# Patient Record
Sex: Male | Born: 1975
Health system: Southern US, Community
[De-identification: ages and names within clinical notes are randomized; demographics above are authoritative.]

## PROBLEM LIST (undated history)

## (undated) DIAGNOSIS — R569 Unspecified convulsions: Secondary | ICD-10-CM

## (undated) HISTORY — DX: Unspecified convulsions: R56.9

---

## 2005-07-18 ENCOUNTER — Emergency Department (HOSPITAL_COMMUNITY): Admission: EM | Admit: 2005-07-18 | Discharge: 2005-07-18 | Payer: Self-pay | Admitting: Family Medicine

## 2009-06-17 ENCOUNTER — Encounter: Payer: Self-pay | Admitting: Family Medicine

## 2009-06-17 ENCOUNTER — Ambulatory Visit: Payer: Self-pay | Admitting: Family Medicine

## 2009-06-17 DIAGNOSIS — F17200 Nicotine dependence, unspecified, uncomplicated: Secondary | ICD-10-CM | POA: Insufficient documentation

## 2009-06-17 DIAGNOSIS — R030 Elevated blood-pressure reading, without diagnosis of hypertension: Secondary | ICD-10-CM | POA: Insufficient documentation

## 2009-06-17 LAB — CONVERTED CEMR LAB
Chloride: 105 meq/L (ref 96–112)
Cholesterol: 170 mg/dL (ref 0–200)
Glucose, Bld: 94 mg/dL (ref 70–99)
LDL Cholesterol: 109 mg/dL — ABNORMAL HIGH (ref 0–99)
Potassium: 4.2 meq/L (ref 3.5–5.3)
Sodium: 140 meq/L (ref 135–145)
Total CHOL/HDL Ratio: 3.4
Triglycerides: 54 mg/dL (ref <150)
VLDL: 11 mg/dL (ref 0–40)

## 2009-06-21 ENCOUNTER — Encounter: Payer: Self-pay | Admitting: Family Medicine

## 2009-09-02 ENCOUNTER — Emergency Department (HOSPITAL_COMMUNITY): Admission: EM | Admit: 2009-09-02 | Discharge: 2009-09-02 | Payer: Self-pay | Admitting: Family Medicine

## 2009-09-02 ENCOUNTER — Emergency Department (HOSPITAL_COMMUNITY): Admission: EM | Admit: 2009-09-02 | Discharge: 2009-09-03 | Payer: Self-pay | Admitting: Emergency Medicine

## 2010-06-24 NOTE — Letter (Signed)
Summary: Lipid Letter  [facility]  [address]   [place], [place] [address]   Phone: [phone]  Fax: [phone]    06/21/2009  Clarence Larsen [address] [place], [place]  [address]  Dear Clarence Larsen:  We have carefully reviewed your last lipid profile from 06/17/2009 and the results are noted below with a summary of recommendations for lipid management.    Cholesterol:       170     Goal: < 200   HDL "good" Cholesterol:   50     Goal: > 45   LDL "bad" Cholesterol:   109     Goal: < 100   Triglycerides:       54     Goal: < 150     Your electrolytes (sodium, potassium) were normal. Your kidney function was normal.     Adjunctive Measures (may lower LIPIDS and reduce risk of Heart Attack) include: Aerobic Exercise (20-30 minutes 3-4 times a week) Limit Alcohol Consumption Weight Reduction Dietary Fiber 20-30 grams a day by mouth     Current Medications:  None  The above results are within normal limits. No new mediations are needed at this point. Continue to watch your sodium intake, to avoid high blood pressure.  If you have any questions, please call. We appreciate being able to work with you.   Sincerely,   Phillips Hay MD [facility]  Appended Document: Lipid Letter mailed letter to pt

## 2010-06-24 NOTE — Assessment & Plan Note (Signed)
Summary: np,df   Vital Signs:  Patient profile:   35 year old male Height:      69.70 inches Weight:      218 pounds BMI:     31.66 Temp:     98.4 degrees F oral Pulse rate:   76 / minute BP sitting:   143 / 83  (left arm) Cuff size:   large  Vitals Entered By: Tessie Fass CMA (June 17, 2009 3:06 PM)    CC: complete physical Is Patient Diabetic? No Pain Assessment Patient in pain? no        CC:  complete physical.  History of Present Illness:   35 y.o. male with no pmh, has not seen a physician since he was a teenager. 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    Physical Exam  General:  Well-developed,well-nourished,in no acute distress; alert,appropriate and cooperative throughout examination Vital signs noted  Eyes:   EOMI. Perrla. Funduscopic exam benign, without hemorrhages, exudates or papilledema. Vision grossly normal. Ears:  External ear exam shows no significant lesions or deformities.  Otoscopic examination reveals clear canals, tympanic membranes are intact bilaterally without bulging, retraction, inflammation or discharge. Hearing is  grossly normal bilaterally. Mouth:  Oral mucosa and oropharynx without lesions or exudates.  Teeth in good repair.  Neck:  No deformities, masses, or tenderness noted. Lungs:  Normal respiratory effort, chest expands symmetrically. Lungs are clear to auscultation, no crackles or wheezes. Heart:  Normal rate and regular rhythm. S1 and S2 normal without gallop, murmur, click, rub or other extra sounds. Abdomen:  soft, non-tender, and normal bowel sounds.   Extremities:  no edema Neurologic:  alert & oriented X3, cranial nerves II-XII intact, strength normal in all extremities, and gait normal.     Impression & Recommendations:  Problem # 1:  Preventive Health Care (ICD-V70.0) Assessment New No red flags. Lipids and BMET done today  Problem # 2:  ELEVATED BP READING WITHOUT DX HYPERTENSION (ICD-796.2) Assessment: New See instructions below- discussed low salt diet and exercise. Handout given Orders: Basic Met-[facility] [phone]) Lipid-[facility] ([id number])  Problem # 3:  TOBACCO USER (ICD-305.1) Assessment: New Currently not ready to quit. Given handout on smoking and free cessation classes  Patient Instructions: 1)  Check your blood pressure at least  once a week and record 2)  Bring to next visit 3)  When you are ready to quit smoking please let me know  4)  I will send you your lab results 5)  Your next visit is in 3months

## 2010-11-06 ENCOUNTER — Ambulatory Visit (INDEPENDENT_AMBULATORY_CARE_PROVIDER_SITE_OTHER): Payer: Self-pay | Admitting: Family Medicine

## 2010-11-06 ENCOUNTER — Encounter: Payer: Self-pay | Admitting: Family Medicine

## 2010-11-06 VITALS — BP 138/89 | HR 80 | Temp 98.9°F | Ht 69.7 in | Wt 197.0 lb

## 2010-11-06 DIAGNOSIS — R109 Unspecified abdominal pain: Secondary | ICD-10-CM

## 2010-11-06 DIAGNOSIS — R634 Abnormal weight loss: Secondary | ICD-10-CM

## 2010-11-06 LAB — CBC WITH DIFFERENTIAL/PLATELET
Basophils Relative: 1 % (ref 0–1)
Eosinophils Absolute: 0.2 10*3/uL (ref 0.0–0.7)
Eosinophils Relative: 4 % (ref 0–5)
HCT: 54.6 % — ABNORMAL HIGH (ref 39.0–52.0)
Hemoglobin: 18.3 g/dL — ABNORMAL HIGH (ref 13.0–17.0)
MCH: 30.3 pg (ref 26.0–34.0)
MCHC: 33.5 g/dL (ref 30.0–36.0)
MCV: 90.4 fL (ref 78.0–100.0)
Monocytes Absolute: 0.4 10*3/uL (ref 0.1–1.0)
Monocytes Relative: 8 % (ref 3–12)

## 2010-11-06 LAB — COMPREHENSIVE METABOLIC PANEL
AST: 19 U/L (ref 0–37)
Alkaline Phosphatase: 72 U/L (ref 39–117)
BUN: 9 mg/dL (ref 6–23)
Creat: 0.85 mg/dL (ref 0.50–1.35)

## 2010-11-06 LAB — POCT H PYLORI SCREEN: H Pylori Screen, POC: POSITIVE

## 2010-11-06 MED ORDER — CLARITHROMYCIN 500 MG PO TABS: 500.0000 mg | ORAL_TABLET | Freq: Every day | ORAL | Status: AC

## 2010-11-06 MED ORDER — OMEPRAZOLE 20 MG PO CPDR: 20.0000 mg | DELAYED_RELEASE_CAPSULE | Freq: Two times a day (BID) | ORAL | Status: DC

## 2010-11-06 MED ORDER — AMOXICILLIN 500 MG PO CAPS: ORAL_CAPSULE | ORAL | Status: AC

## 2010-11-06 NOTE — Assessment & Plan Note (Signed)
Unintentional weight loss, no signs on exam, treat abd pain per above, H pylori infection may be contribuiting to weight loss Check routine labs, if persistant weight loss obtain CXR, CT Abd/pelvis

## 2010-11-06 NOTE — Progress Notes (Signed)
  Subjective:    Patient ID: Clarence Larsen, male    DOB: [birth year], 35 y.o.   MRN: [id number]  HPI  Abd pain x 1 week- epigastric pain, felt like indigestion, has had in the past but feels worse this week, Tums have helped and Zantac   no n/v, no fever, + black tarry stools on 2 occ within past 6 months, last event last thurs. He has had a few episodes of loose stools, no BRBPR , no difficulty with  Urine stream, no CP, SOB  Weight loss- 30lb unintentional weight loss in 6 months, smokes 1ppd,drinks 2 beers a night, no illicits, father recently diagnosed with spleen cancer  Review of Systems per above     Objective:   Physical Exam   GEN- NAD, alert and oriented, well nourished   Lymph- no cervical or clavicular nodes   CVS- RRR, no murmur   RESP- CTAB   ABD- NABS, Soft mild TTP epigastric region, no rebound, no gaurding, no hepatospleenomegaly   Ext- no edema       Assessment & Plan:

## 2010-11-06 NOTE — Patient Instructions (Signed)
Read the handout on H.Pylori bacteria We will call with your lab results Take the antibiotics as prescribed If they are too expensive please give me a call Make a follow-up appt in 1 month Keep taking the Prilosec even after you finish the antibiotics

## 2010-11-06 NOTE — Assessment & Plan Note (Addendum)
Pain more consistent with either GERD/ ulceration from H pylori, Gallbladder etiology, will also check for pancreatitis- CMET, CBC H pylori positive will treat this now - with Amox, Prilosec and Clarithromycin Note FOBT- neg at bedside, no sign of acute bleed

## 2010-11-07 ENCOUNTER — Telehealth: Payer: Self-pay | Admitting: Family Medicine

## 2010-11-07 NOTE — Telephone Encounter (Signed)
Called and left a message on pt home voice mail-please give the following message: Your kidney, liver and gallbladder labs were normal Your infection fighting cells were normal Your pancreas labs were normal  Your hemoglobin is slightly high, this is likley because of your smoking, this would be a great time to cut back and quit smoking, currently it is not at a dangerous level. This blood test will need to be repeated when you come back for your follow-up appointment

## 2010-11-11 NOTE — Telephone Encounter (Signed)
Can we make sure he received this message

## 2012-01-07 ENCOUNTER — Emergency Department (HOSPITAL_COMMUNITY)
Admission: EM | Admit: 2012-01-07 | Discharge: 2012-01-07 | Disposition: A | Discharge status: Home / Self Care | Payer: 59 | Attending: Emergency Medicine | Admitting: Emergency Medicine

## 2012-01-07 ENCOUNTER — Encounter (HOSPITAL_COMMUNITY): Payer: Self-pay | Admitting: Emergency Medicine

## 2012-01-07 DIAGNOSIS — F17200 Nicotine dependence, unspecified, uncomplicated: Secondary | ICD-10-CM | POA: Insufficient documentation

## 2012-01-07 DIAGNOSIS — M5430 Sciatica, unspecified side: Secondary | ICD-10-CM

## 2012-01-07 LAB — URINALYSIS, ROUTINE W REFLEX MICROSCOPIC
Bilirubin Urine: NEGATIVE
Hgb urine dipstick: NEGATIVE
Nitrite: NEGATIVE
Specific Gravity, Urine: 1.012 (ref 1.005–1.030)
pH: 5.5 (ref 5.0–8.0)

## 2012-01-07 MED ORDER — METHYLPREDNISOLONE 4 MG PO KIT: PACK | ORAL | Status: AC

## 2012-01-07 MED ORDER — IBUPROFEN 800 MG PO TABS: 800.0000 mg | ORAL_TABLET | Freq: Three times a day (TID) | ORAL | Status: AC

## 2012-01-07 MED ORDER — IBUPROFEN 800 MG PO TABS
800.0000 mg | ORAL_TABLET | Freq: Once | ORAL | Status: AC
  Administered 2012-01-07: 800 mg via ORAL
  Filled 2012-01-07: qty 1

## 2012-01-07 MED ORDER — OXYCODONE-ACETAMINOPHEN 5-325 MG PO TABS
2.0000 | ORAL_TABLET | Freq: Once | ORAL | Status: AC
  Administered 2012-01-07: 2 via ORAL
  Filled 2012-01-07: qty 2

## 2012-01-07 NOTE — ED Notes (Signed)
Prescriptions x2 given with discharge instructions.  

## 2012-01-07 NOTE — ED Provider Notes (Signed)
History     CSN: [id number]  Arrival date & time 01/07/12  4098   First MD Initiated Contact with Patient 01/07/12 [phone]      Chief Complaint  Patient presents with  . Back Pain    (Consider location/radiation/quality/duration/timing/severity/associated sxs/prior treatment) HPI Comments: Patient presents with 24 hours of left-sided low back pain it radiates down his left knee. He denies any injury to his back or lifting. He been taking Tylenol, Goody powder without relief. The pain was worse at night he could not sleep. He denies any weakness in the leg but does have a pins and needle sensation in the back of it. No bowel or bladder incontinence, fevers, vomiting, IV drug use. No urinary symptoms no testicular pain no abdominal pain. No previous history of back problems.  The history is provided by the patient.    History reviewed. No pertinent past medical history.  History reviewed. No pertinent past surgical history.  Family History  Problem Relation Age of Onset  . Cancer Father     Spleen cancer on chemo    History  Substance Use Topics  . Smoking status: Current Everyday Smoker -- 1.0 packs/day    Types: Cigarettes  . Smokeless tobacco: Not on file  . Alcohol Use: Yes      Review of Systems  Constitutional: Negative for fever, activity change and appetite change.  HENT: Negative for congestion and rhinorrhea.   Respiratory: Negative for cough, chest tightness and shortness of breath.   Cardiovascular: Negative for chest pain.  Gastrointestinal: Negative for nausea, vomiting and abdominal pain.  Genitourinary: Negative for dysuria, hematuria and testicular pain.  Musculoskeletal: Positive for back pain and gait problem.  Skin: Negative for rash.  Neurological: Negative for dizziness and headaches.    Allergies  Review of patient's allergies indicates no known allergies.  Home Medications   Current Outpatient Rx  Name Route Sig Dispense Refill  .  ACETAMINOPHEN 500 MG PO TABS Oral Take 1,000 mg by mouth every 6 (six) hours as needed. pain    . IBUPROFEN 800 MG PO TABS Oral Take 1 tablet (800 mg total) by mouth 3 (three) times daily. 21 tablet 0  . METHYLPREDNISOLONE 4 MG PO KIT  follow package directions 21 tablet 0  . OMEPRAZOLE 20 MG PO CPDR Oral Take 1 capsule (20 mg total) by mouth 2 (two) times daily. 60 capsule 1    BP 130/86  Pulse 88  Temp 97.7 F (36.5 C) (Oral)  Resp 14  SpO2 97%  Physical Exam  Constitutional: He is oriented to person, place, and time. He appears well-developed and well-nourished. No distress.  HENT:  Head: Normocephalic and atraumatic.  Mouth/Throat: Oropharynx is clear and moist. No oropharyngeal exudate.  Eyes: Conjunctivae are normal. Pupils are equal, round, and reactive to light.  Neck: Normal range of motion.  Cardiovascular: Normal rate, regular rhythm and normal heart sounds.   No murmur heard. Pulmonary/Chest: Effort normal and breath sounds normal. No respiratory distress.  Abdominal: Soft. There is no tenderness. There is no rebound.  Musculoskeletal: Normal range of motion. He exhibits tenderness.       Left-sided paraspinal lumbar tenderness SI joint tenderness. No step-off  Neurological: He is alert and oriented to person, place, and time. No cranial nerve deficit.       5 Out of 5 strength in bilateral lower extremities, +2 patellar reflexes bilaterally, great toe extension intact, +2 DP and PT pulses bilaterally. Valgus deformity left great toe  Skin: Skin is warm.    ED Course  Procedures (including critical care time)   Labs Reviewed  URINALYSIS, ROUTINE W REFLEX MICROSCOPIC   No results found.   1. Sciatica       MDM  Left-sided low back pain radiating to the left leg consistent with sciatica. No neurological deficits. Antalgic gait.  Sciatica-type pain without neurological deficit or red flag.  We'll treat with steroids, anti-inflammatories, pain medication and  referral to PCP. Return precautions discussed.      Glynn Octave, MD 01/07/12 [phone]

## 2012-01-07 NOTE — ED Notes (Signed)
PT. WOKE UP THIS MORNING WITH LOW BACK PAIN RADIATING TO LEFT LEG , DENIES INJURY OR FALL , AMBULATORY AT TRIAGE, NO DYSURIA OR HEMATURIA.

## 2012-01-07 NOTE — ED Notes (Signed)
Pt. Informed of need to urinate

## 2012-01-12 ENCOUNTER — Encounter: Payer: Self-pay | Admitting: Family Medicine

## 2012-01-12 ENCOUNTER — Ambulatory Visit (INDEPENDENT_AMBULATORY_CARE_PROVIDER_SITE_OTHER): Payer: 59 | Admitting: Family Medicine

## 2012-01-12 VITALS — BP 131/86 | HR 72 | Wt 187.0 lb

## 2012-01-12 DIAGNOSIS — M545 Low back pain, unspecified: Secondary | ICD-10-CM

## 2012-01-12 NOTE — Assessment & Plan Note (Signed)
Resolved at this point.  I do not see any indications for imaging at this point.  Likely can be traced back to his new workout regimen.  Advised to return if worsening again.

## 2012-01-12 NOTE — Progress Notes (Signed)
  Subjective:    Patient ID: Glade Nurse, male    DOB: March 14, 1976, 36 y.o.   MRN: [id number]  HPI  1.  F/u ED:  Patient here to f/u from ED visit on 8/15.  Was evaluated in the ED for low back pain that radiated into buttock and back of leg.  Difficulty walking 2/2 to pain at that time.  Now says that pain is greatly improved and almost resolved.  He has completed steroid pack.  He thinks this was caused by him starting to work out again and over-doing it.   No further radiation of pain into leg and no difficulty walking.  Denies bowel or bladder problems.   Review of Systems Per HPI    Objective:   Physical Exam  Constitutional: He appears well-nourished. No distress.  Abdominal: Soft. He exhibits no distension. There is no tenderness.  Musculoskeletal: Normal range of motion.       Sitting and lying SLR negative.  Able to ambulate without any difficulty.  No L spine tenderness.  Sensation intact.           Assessment & Plan:

## 2013-03-01 ENCOUNTER — Encounter (HOSPITAL_COMMUNITY): Payer: Self-pay | Admitting: Emergency Medicine

## 2013-03-01 ENCOUNTER — Emergency Department (HOSPITAL_COMMUNITY)
Admission: EM | Admit: 2013-03-01 | Discharge: 2013-03-01 | Disposition: A | Discharge status: Home / Self Care | Payer: 59 | Attending: Emergency Medicine | Admitting: Emergency Medicine

## 2013-03-01 DIAGNOSIS — M67919 Unspecified disorder of synovium and tendon, unspecified shoulder: Secondary | ICD-10-CM | POA: Insufficient documentation

## 2013-03-01 DIAGNOSIS — M719 Bursopathy, unspecified: Secondary | ICD-10-CM | POA: Insufficient documentation

## 2013-03-01 DIAGNOSIS — Z79899 Other long term (current) drug therapy: Secondary | ICD-10-CM | POA: Insufficient documentation

## 2013-03-01 DIAGNOSIS — F17200 Nicotine dependence, unspecified, uncomplicated: Secondary | ICD-10-CM | POA: Insufficient documentation

## 2013-03-01 DIAGNOSIS — M7581 Other shoulder lesions, right shoulder: Secondary | ICD-10-CM

## 2013-03-01 MED ORDER — NAPROXEN 500 MG PO TABS: 500.0000 mg | ORAL_TABLET | Freq: Two times a day (BID) | ORAL | Status: DC

## 2013-03-01 MED ORDER — TRAMADOL HCL 50 MG PO TABS: 50.0000 mg | ORAL_TABLET | Freq: Four times a day (QID) | ORAL | Status: DC | PRN

## 2013-03-01 NOTE — ED Notes (Signed)
Pt states it hurts to lift his right arm.  Pt sattes pain started Sunday and has gotten progressily worse

## 2013-03-01 NOTE — ED Provider Notes (Signed)
CSN: [id number]     Arrival date & time 03/01/13  4098 History   First MD Initiated Contact with Patient 03/01/13 [phone]     Chief Complaint  Patient presents with  . Shoulder Pain    HPI  Atraumatic right shoulder pain for 3 days.  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 He does not do any overhead repetitive motion work. Started having pain in superior and anterior  lateral aspect of the right shoulder on Friday. Is in  to the point that he really couldn't sleep last night states that thought a toothache. Hurts to move and trouble elevating his arm.  History reviewed. No pertinent past medical history. History reviewed. No pertinent past surgical history. Family History  Problem Relation Age of Onset  . Cancer Father     Spleen cancer on chemo   History  Substance Use Topics  . Smoking status: Current Every Day Smoker -- 1.00 packs/day    Types: Cigarettes  . Smokeless tobacco: Not on file  . Alcohol Use: Yes    Review of Systems  Respiratory: Negative for shortness of breath.   Cardiovascular: Negative for chest pain.  Musculoskeletal: Positive for arthralgias and myalgias.    Allergies  Review of patient's allergies indicates no known allergies.  Home Medications   Current Outpatient Rx  Name  Route  Sig  Dispense  Refill  . acetaminophen (TYLENOL) 500 MG tablet   Oral   Take 1,000 mg by mouth every 6 (six) hours as needed. pain         . naproxen (NAPROSYN) 500 MG tablet   Oral   Take 1 tablet (500 mg total) by mouth 2 (two) times daily.   30 tablet   0   . traMADol (ULTRAM) 50 MG tablet   Oral   Take 1 tablet (50 mg total) by mouth every 6 (six) hours as needed for pain.   15 tablet   0    BP 139/95  Pulse 84  Temp(Src) 99 F (37.2 C) (Oral)  Resp 18  SpO2 96% Physical Exam  Musculoskeletal:       Right shoulder: He exhibits decreased range of motion. He exhibits no swelling, no effusion and no crepitus.  As a painful ARC  with the right shoulder starting at about 30. His painful directly over the anterior aspect of the shoulder at the proximal humerus. He has pain with internal rotation more so with resisted internal rotation. Passive external rotation causes pain as well. He does have some pain with biceps flexion as well.    ED Course  Procedures (including critical care time) Labs Review Labs Reviewed - No data to display Imaging Review No results found.  MDM   1. Rotator cuff tendinitis, right    Classic exam pertinent for tendinitis. Plan rest avoid heavy or or overhead lifting. Primary care followup for orthopedic followup if not improving    Roney Marion, MD 03/01/13 [phone]

## 2015-02-13 ENCOUNTER — Ambulatory Visit (INDEPENDENT_AMBULATORY_CARE_PROVIDER_SITE_OTHER): Payer: 59 | Admitting: Family Medicine

## 2015-02-13 ENCOUNTER — Ambulatory Visit
Admission: RE | Admit: 2015-02-13 | Discharge: 2015-02-13 | Disposition: A | Discharge status: Home / Self Care | Payer: 59 | Source: Ambulatory Visit | Attending: Family Medicine | Admitting: Family Medicine

## 2015-02-13 ENCOUNTER — Encounter: Payer: Self-pay | Admitting: Family Medicine

## 2015-02-13 VITALS — BP 123/88 | HR 79 | Temp 97.8°F | Wt 240.0 lb

## 2015-02-13 DIAGNOSIS — M5442 Lumbago with sciatica, left side: Secondary | ICD-10-CM

## 2015-02-13 MED ORDER — TRAMADOL HCL 50 MG PO TABS: 50.0000 mg | ORAL_TABLET | Freq: Four times a day (QID) | ORAL | Status: DC | PRN

## 2015-02-13 NOTE — Progress Notes (Signed)
  HPI:  Pt presents for a same day appointment to discuss low back pain.  Began having L sided low back pain about 2 weeks ago. Has gotten worse since then. Has tried tylenol which helped initially but no longer getting relief. Has had cramping in L leg, and now feels numbness to L leg all the way down to toes. No weakness. No saddle anesthesia, fever, or bowel/bladder incontinence. Just has decreased sensation to L leg. The numbness initially came and went, but now is more persistent.  No visible history of imaging in our system. Pain is worse with standing, improved with sitting. Numbness is entire leg, not confined to a particular aspect of the leg.  ROS: See HPI  PMFSH: history of tobacco abuse, prior elevated BP. Denies any history of seizure.  PHYSICAL EXAM: BP 123/88 mmHg  Pulse 79  Temp(Src) 97.8 F (36.6 C) (Oral)  Wt 240 lb (108.863 kg) Gen: NAD, pleasant, cooperative HEENT: NCAT Back: low back nontender to palpation Ext: full strength bilateral lower extremities with hip flexion/abduction/adduction and knee flexion/extension. 2+ patellar reflexes bilaterally. Decreased sensation over L lower extremity with light touch. Gait normal.  ASSESSMENT/PLAN:  Low back pain Acute low back pain of 2 weeks duration, with L leg numbness. No signs of cauda equina to prompt need for emergent imaging, but with report of leg numbness warrants further investigation. Patient has full strength in both legs. Plan: - start with xrays of low back.  - anticipate need to proceed with MRI lumbar spine once xrays have returned - tramadol as needed for pain, also add ibuprofen - heating pad - f/u in 2-3 weeks with PCP or myself    FOLLOW UP: F/u in 2-3 weeks for low back pain  [place] J. Pollie Meyer, MD [facility]

## 2015-02-13 NOTE — Assessment & Plan Note (Signed)
Acute low back pain of 2 weeks duration, with L leg numbness. No signs of cauda equina to prompt need for emergent imaging, but with report of leg numbness warrants further investigation. Patient has full strength in both legs. Plan: - start with xrays of low back.  - anticipate need to proceed with MRI lumbar spine once xrays have returned - tramadol as needed for pain, also add ibuprofen - heating pad - f/u in 2-3 weeks with PCP or myself

## 2015-02-13 NOTE — Patient Instructions (Addendum)
Getting xrays Suspect we will need to get MRI Get over the counter ibuprofen Prescribed tramadol for pain - might make you sleepy so use caution Try heating pad, gentle stretching Follow up with Dr. Cathlean Cower in 2-3 weeks to see how things are going.  Be well, Dr. Pollie Meyer   Back Pain, Adult Low back pain is very common. About 1 in 5 people have back pain.The cause of low back pain is rarely dangerous. The pain often gets better over time.About half of people with a sudden onset of back pain feel better in just 2 weeks. About 8 in 10 people feel better by 6 weeks.  CAUSES Some common causes of back pain include:  Strain of the muscles or ligaments supporting the spine.  Wear and tear (degeneration) of the spinal discs.  Arthritis.  Direct injury to the back. DIAGNOSIS Most of the time, the direct cause of low back pain is not known.However, back pain can be treated effectively even when the exact cause of the pain is unknown.Answering your caregiver's questions about your overall health and symptoms is one of the most accurate ways to make sure the cause of your pain is not dangerous. If your caregiver needs more information, he or she may order lab work or imaging tests (X-rays or MRIs).However, even if imaging tests show changes in your back, this usually does not require surgery. HOME CARE INSTRUCTIONS For many people, back pain returns.Since low back pain is rarely dangerous, it is often a condition that people can learn to [facility] their own.   Remain active. It is stressful on the back to sit or stand in one place. Do not sit, drive, or stand in one place for more than 30 minutes at a time. Take short walks on level surfaces as soon as pain allows.Try to increase the length of time you walk each day.  Do not stay in bed.Resting more than 1 or 2 days can delay your recovery.  Do not avoid exercise or work.Your body is made to move.It is not dangerous to be active, even  though your back may hurt.Your back will likely heal faster if you return to being active before your pain is gone.  Pay attention to your body when you bend and lift. Many people have less discomfortwhen lifting if they bend their knees, keep the load close to their bodies,and avoid twisting. Often, the most comfortable positions are those that put less stress on your recovering back.  Find a comfortable position to sleep. Use a firm mattress and lie on your side with your knees slightly bent. If you lie on your back, put a pillow under your knees.  Only take over-the-counter or prescription medicines as directed by your caregiver. Over-the-counter medicines to reduce pain and inflammation are often the most helpful.Your caregiver may prescribe muscle relaxant drugs.These medicines help dull your pain so you can more quickly return to your normal activities and healthy exercise.  Put ice on the injured area.  Put ice in a plastic bag.  Place a towel between your skin and the bag.  Leave the ice on for 15-20 minutes, 03-04 times a day for the first 2 to 3 days. After that, ice and heat may be alternated to reduce pain and spasms.  Ask your caregiver about trying back exercises and gentle massage. This may be of some benefit.  Avoid feeling anxious or stressed.Stress increases muscle tension and can worsen back pain.It is important to recognize when you are anxious  or stressed and learn ways to manage it.Exercise is a great option. SEEK MEDICAL CARE IF:  You have pain that is not relieved with rest or medicine.  You have pain that does not improve in 1 week.  You have new symptoms.  You are generally not feeling well. SEEK IMMEDIATE MEDICAL CARE IF:   You have pain that radiates from your back into your legs.  You develop new bowel or bladder control problems.  You have unusual weakness or numbness in your arms or legs.  You develop nausea or vomiting.  You develop  abdominal pain.  You feel faint. Document Released: 05/11/2005 Document Revised: 11/10/2011 Document Reviewed: 09/12/2013 [facility] Patient Information 2015 [place], [place]. This information is not intended to replace advice given to you by your health care Umberto Pavek. Make sure you discuss any questions you have with your health care Jaysin Gayler.

## 2015-02-14 ENCOUNTER — Telehealth: Payer: Self-pay | Admitting: Family Medicine

## 2015-02-14 DIAGNOSIS — M5442 Lumbago with sciatica, left side: Secondary | ICD-10-CM

## 2015-02-14 NOTE — Telephone Encounter (Signed)
Called patient to discuss spine xray results. Mild compression deformity of T12 vertebral body, otherwise just mild degenerative changes. No obvious explanation for symptoms. Will proceed with MRI of lumbar spine and ask that they capture T12 body as well in imaging.  Red team, please schedule MRI and call patient with appointment time. Thanks, Latrelle Dodrill, MD

## 2015-02-20 NOTE — Telephone Encounter (Signed)
MRI scheduled at [facility] (radiology dept.) for 10/6 at 4:45. Left message on voicemail informing patient.

## 2015-02-28 ENCOUNTER — Ambulatory Visit (HOSPITAL_COMMUNITY): Admission: RE | Admit: 2015-02-28 | Payer: 59 | Source: Ambulatory Visit

## 2015-03-12 ENCOUNTER — Ambulatory Visit (HOSPITAL_COMMUNITY): Admission: RE | Admit: 2015-03-12 | Payer: 59 | Source: Ambulatory Visit

## 2015-03-26 ENCOUNTER — Ambulatory Visit (HOSPITAL_COMMUNITY): Admission: RE | Admit: 2015-03-26 | Payer: 59 | Source: Ambulatory Visit

## 2016-04-23 ENCOUNTER — Ambulatory Visit (INDEPENDENT_AMBULATORY_CARE_PROVIDER_SITE_OTHER): Payer: 59 | Admitting: Family Medicine

## 2016-04-23 ENCOUNTER — Ambulatory Visit (HOSPITAL_COMMUNITY)
Admission: RE | Admit: 2016-04-23 | Discharge: 2016-04-23 | Disposition: A | Discharge status: Home / Self Care | Payer: 59 | Source: Ambulatory Visit | Attending: Family Medicine | Admitting: Family Medicine

## 2016-04-23 ENCOUNTER — Emergency Department (HOSPITAL_COMMUNITY)
Admission: EM | Admit: 2016-04-23 | Discharge: 2016-04-23 | Disposition: A | Discharge status: Home / Self Care | Payer: 59 | Attending: Emergency Medicine | Admitting: Emergency Medicine

## 2016-04-23 ENCOUNTER — Emergency Department (HOSPITAL_COMMUNITY): Payer: 59

## 2016-04-23 ENCOUNTER — Encounter (HOSPITAL_COMMUNITY): Payer: Self-pay | Admitting: Emergency Medicine

## 2016-04-23 VITALS — BP 135/101 | HR 85 | Temp 97.8°F | Wt 235.6 lb

## 2016-04-23 DIAGNOSIS — R079 Chest pain, unspecified: Secondary | ICD-10-CM

## 2016-04-23 DIAGNOSIS — F17210 Nicotine dependence, cigarettes, uncomplicated: Secondary | ICD-10-CM | POA: Diagnosis not present

## 2016-04-23 DIAGNOSIS — I4891 Unspecified atrial fibrillation: Secondary | ICD-10-CM | POA: Diagnosis not present

## 2016-04-23 DIAGNOSIS — Z20828 Contact with and (suspected) exposure to other viral communicable diseases: Secondary | ICD-10-CM | POA: Diagnosis not present

## 2016-04-23 DIAGNOSIS — R1110 Vomiting, unspecified: Secondary | ICD-10-CM | POA: Diagnosis not present

## 2016-04-23 DIAGNOSIS — Z5321 Procedure and treatment not carried out due to patient leaving prior to being seen by health care provider: Secondary | ICD-10-CM | POA: Diagnosis not present

## 2016-04-23 LAB — BASIC METABOLIC PANEL
Anion gap: 11 (ref 5–15)
BUN: 10 mg/dL (ref 6–20)
CO2: 22 mmol/L (ref 22–32)
Calcium: 9.7 mg/dL (ref 8.9–10.3)
Chloride: 102 mmol/L (ref 101–111)
Creatinine, Ser: 0.75 mg/dL (ref 0.61–1.24)
GFR calc Af Amer: >60 mL/min (ref >60)
GLUCOSE: 103 mg/dL — AB (ref 65–99)
POTASSIUM: 4 mmol/L (ref 3.5–5.1)
Sodium: 135 mmol/L (ref 135–145)

## 2016-04-23 LAB — CBC
HEMATOCRIT: 56 % — AB (ref 39.0–52.0)
Hemoglobin: 19.5 g/dL — ABNORMAL HIGH (ref 13.0–17.0)
MCH: 28.7 pg (ref 26.0–34.0)
MCHC: 34.8 g/dL (ref 30.0–36.0)
MCV: 82.4 fL (ref 78.0–100.0)
Platelets: 161 10*3/uL (ref 150–400)
RBC: 6.8 MIL/uL — ABNORMAL HIGH (ref 4.22–5.81)
RDW: 13.5 % (ref 11.5–15.5)
WBC: 6.7 10*3/uL (ref 4.0–10.5)

## 2016-04-23 LAB — LIPID PANEL
CHOL/HDL RATIO: 3.7 ratio (ref <5.0)
Cholesterol: 189 mg/dL (ref <200)
HDL: 51 mg/dL (ref >40)
LDL CALC: 117 mg/dL — AB (ref <100)
TRIGLYCERIDES: 104 mg/dL (ref <150)
VLDL: 21 mg/dL (ref <30)

## 2016-04-23 LAB — I-STAT TROPONIN, ED: Troponin i, poc: 0 ng/mL (ref 0.00–0.08)

## 2016-04-23 MED ORDER — METOPROLOL SUCCINATE ER 25 MG PO TB24: 25.0000 mg | ORAL_TABLET | Freq: Every day | ORAL | 1 refills | Status: DC

## 2016-04-23 MED ORDER — ASPIRIN 81 MG PO CHEW: 81.0000 mg | CHEWABLE_TABLET | Freq: Every day | ORAL | 1 refills | Status: DC

## 2016-04-23 NOTE — Progress Notes (Signed)
   HPI  CC: CP Yesterday felts some heartburn. Heartburn is substernal. No pressure. One episode of vomiting. Diaphoresis (profuse) last night. Took some Tums which provided some relief, but heartburn recurred. Went to ED at that time.   Now having some paresthesias in feet R>L.   Some increased SOB with exertion. Endorses recent fatigue.  Denies headache, blurred vision, dizziness, lightheadedness, palpitations, fevers, chills, diarrhea, trauma, injury, swelling/edema, numbness, weakness, paresthesias.  No heart issues in the past.  EtOH: 1 6pack a day Smoking: 1.5packs a day x 15-6241yrs THC occasionally. No cocaine/meth.  Review of Systems    See HPI for ROS. All other systems reviewed and are negative.  CC, SH/smoking status, and VS noted  Objective: BP (!) 135/101 (BP Location: Left Arm, Patient Position: Sitting, Cuff Size: Normal)   Pulse 85   Temp 97.8 F (36.6 C) (Oral)   Wt 235 lb 9.6 oz (106.9 kg)   SpO2 96%   BMI 34.10 kg/m  Gen: NAD, alert, cooperative, and pleasant. HEENT: NCAT, EOMI, PERRL, MMM CV: Irregularly irregular, no murmur/rub Resp: CTAB, no wheezes, non-labored Abd: SNTND, BS present, no guarding or organomegaly Ext: No edema, warm, pulses present bilat. Neuro: Alert and oriented, Speech clear, No gross deficits  Assessment and plan:  Atrial fibrillation (HCC) New onset: Patient appears to have newly diagnosed A. Fib. He is currently completely asymptomatic at this time. Initial EKG in the ED this morning showed A. fib with RVR. Repeat EKG in our clinic showed normal rate. No evidence of ischemia. Troponin in the ED was negative. Patient is a heavy smoker and drinker. These likely have contributed to this new change. He denies any cocaine or methamphetamine use. - Referral to cardiology - Obtain TSH and lipid panel (CBC and BMP already obtained this morning in ED) - Initiate metoprolol 25 mg daily - Initiate baby aspirin. - Follow-up with  PCP   Orders Placed This Encounter  Procedures  . TSH  . Lipid panel  . HIV antibody  . Ambulatory referral to Cardiology    Referral Priority:   Routine    Referral Type:   Consultation    Referral Reason:   Specialty Services Required    Requested Specialty:   Cardiology    Number of Visits Requested:   1  . EKG 12-Lead  . EKG 12-Lead    Ordered by MARSHALL CHAMBLISS     Meds ordered this encounter  Medications  . metoprolol succinate (TOPROL-XL) 25 MG 24 hr tablet    Sig: Take 1 tablet (25 mg total) by mouth daily.    Dispense:  30 tablet    Refill:  1  . aspirin 81 MG chewable tablet    Sig: Chew 1 tablet (81 mg total) by mouth daily.    Dispense:  30 tablet    Refill:  1     Kathee DeltonIan D McKeag, MD,MS,  PGY3 04/23/2016 2:03 PM

## 2016-04-23 NOTE — ED Triage Notes (Signed)
Patient reports central chest pain with SOB, emesis and diaphoresis onset yesterday , described pain as " heartburn" .

## 2016-04-23 NOTE — Patient Instructions (Signed)
It was a pleasure seeing you today in our clinic. Today we discussed your chest pain. Here is the treatment plan we have discussed and agreed upon together:   I believe you've developed a condition called atrial fibrillation. - I prescribed you metoprolol. Take this medication once daily to help control your heart rate. - I would like for you to take one baby aspirin a day. I prescribed this to you but you can also pick this up over-the-counter. - I referred you to cardiology. You will be contacted from their office to set up an appointment. - If you experience any new and persistent pressure-like chest pain, constant nausea/vomiting, or persistent shortness of breath do not hesitate to seek immediate medical attention. - I would like for you to follow-up with your primary care provider in 1-2 weeks.

## 2016-04-23 NOTE — Assessment & Plan Note (Signed)
New onset: Patient appears to have newly diagnosed A. Fib. He is currently completely asymptomatic at this time. Initial EKG in the ED this morning showed A. fib with RVR. Repeat EKG in our clinic showed normal rate. No evidence of ischemia. Troponin in the ED was negative. Patient is a heavy smoker and drinker. These likely have contributed to this new change. He denies any cocaine or methamphetamine use. - Referral to cardiology - Obtain TSH and lipid panel (CBC and BMP already obtained this morning in ED) - Initiate metoprolol 25 mg daily - Initiate baby aspirin. - Follow-up with PCP

## 2016-04-24 LAB — HIV ANTIBODY (ROUTINE TESTING W REFLEX): HIV: NONREACTIVE

## 2016-04-24 LAB — TSH: TSH: 1.43 mIU/L (ref 0.40–4.50)

## 2016-05-08 ENCOUNTER — Ambulatory Visit (INDEPENDENT_AMBULATORY_CARE_PROVIDER_SITE_OTHER): Payer: 59 | Admitting: Internal Medicine

## 2016-05-08 VITALS — BP 124/82 | HR 75 | Temp 98.4°F | Ht 71.0 in | Wt 240.2 lb

## 2016-05-08 DIAGNOSIS — I4891 Unspecified atrial fibrillation: Secondary | ICD-10-CM | POA: Diagnosis not present

## 2016-05-08 DIAGNOSIS — F1029 Alcohol dependence with unspecified alcohol-induced disorder: Secondary | ICD-10-CM | POA: Diagnosis not present

## 2016-05-08 DIAGNOSIS — F1010 Alcohol abuse, uncomplicated: Secondary | ICD-10-CM | POA: Insufficient documentation

## 2016-05-08 DIAGNOSIS — D751 Secondary polycythemia: Secondary | ICD-10-CM

## 2016-05-08 DIAGNOSIS — F17200 Nicotine dependence, unspecified, uncomplicated: Secondary | ICD-10-CM

## 2016-05-08 LAB — CBC
HCT: 50.2 % — ABNORMAL HIGH (ref 38.5–50.0)
Hemoglobin: 16.7 g/dL (ref 13.2–17.1)
MCH: 27.8 pg (ref 27.0–33.0)
MCHC: 33.3 g/dL (ref 32.0–36.0)
MCV: 83.5 fL (ref 80.0–100.0)
MPV: 10 fL (ref 7.5–12.5)
PLATELETS: 209 10*3/uL (ref 140–400)
RBC: 6.01 MIL/uL — AB (ref 4.20–5.80)
RDW: 13.8 % (ref 11.0–15.0)
WBC: 6 10*3/uL (ref 3.8–10.8)

## 2016-05-08 LAB — POCT URINALYSIS DIPSTICK
BILIRUBIN UA: NEGATIVE
Glucose, UA: NEGATIVE
Ketones, UA: NEGATIVE
LEUKOCYTES UA: NEGATIVE
NITRITE UA: NEGATIVE
PH UA: 6.5
Protein, UA: NEGATIVE
RBC UA: NEGATIVE
Spec Grav, UA: 1.02
UROBILINOGEN UA: 1

## 2016-05-08 LAB — HEPATIC FUNCTION PANEL
ALBUMIN: 4.4 g/dL (ref 3.6–5.1)
ALK PHOS: 64 U/L (ref 40–115)
ALT: 21 U/L (ref 9–46)
AST: 21 U/L (ref 10–40)
BILIRUBIN INDIRECT: 0.4 mg/dL (ref 0.2–1.2)
BILIRUBIN TOTAL: 0.5 mg/dL (ref 0.2–1.2)
Bilirubin, Direct: 0.1 mg/dL (ref <=0.2)
TOTAL PROTEIN: 7.4 g/dL (ref 6.1–8.1)

## 2016-05-08 NOTE — Patient Instructions (Signed)
I want you to follow up with cardiology as soon as you can. I want you to get imaging of your heart and of your lungs. Please follow up with me in 1 month. If you need support with your alcohol use, I would recommend alcoholic anonymous.

## 2016-05-08 NOTE — Progress Notes (Signed)
   [facility] Noralee CharsAsiyah Tyeesha Riker, MD Phone: [phone]  Reason For Visit: Follow up visit Atrial Fibrillation  #  New onset A-fibrillation  -  A-fib likely due to patient's drinking. States that he drank to much for thanksgiving  - Need to call and set up an appointment with cardiology  - Denies any palpations, dyspnea, orthopnea, swelling in lower extremities, chest pain or dizziness   EtOH abuse  -Use to drink six pack every day  - Now drinking half a six pack a day  - Not interested in quitting   Tobacco Abuse  - Smokes 1 ppd  - states he is planning to cutting back but does not want quit    Past Medical History Reviewed problem list.  Medications- reviewed and updated No additions to family history Social history- patient is a smoker  Objective: BP 124/82 (BP Location: Right Arm, Patient Position: Sitting, Cuff Size: Large)   Pulse 75   Temp 98.4 F (36.9 C) (Oral)   Ht 5\' 11"  (1.803 m)   Wt 240 lb 3.2 oz (109 kg)   SpO2 97%   BMI 33.50 kg/m  Gen: NAD, alert, cooperative with exam Cardio: regular rate and rhythm, S1S2 heard, no murmurs appreciated Pulm: clear to auscultation bilaterally, no wheezes, rhonchi or rales Extremities: warm, well perfused, No edema  Assessment/Plan: See problem based a/p  Atrial fibrillation (HCC) Regular rate currently. No issues with chest pain, dyspnea, swelling in lower extremities. CHADVASC score 0.  -Continue metoprolol 24 mg daily and Aspirin 81 mg - Obtain ECHO -Follow up with cardiology   Polycythemia Likely due to smoking  - initial work up for polycythemia obtained  - pulse ox normal at rest and during walking throughout the clinic  - Obtained CXR, UA, CBC   Alcohol abuse Drinking half a pack daily  - provided resources on EtOH   TOBACCO USER 1 ppd, not interested in quitting currently

## 2016-05-12 ENCOUNTER — Telehealth: Payer: Self-pay | Admitting: *Deleted

## 2016-05-12 NOTE — Telephone Encounter (Signed)
Patient has an appointment Thursday 05/14/16 at 3 PM for Echocardiogram.  Patient will need a pre-cert with [facility].  Please give them a call when completed at [phone].  Clovis PuMartin, Topher Buenaventura L, RN

## 2016-05-13 NOTE — Telephone Encounter (Signed)
Completed. Auth # [id number]. Called and advised echo dept.

## 2016-05-14 ENCOUNTER — Ambulatory Visit (HOSPITAL_COMMUNITY): Admission: RE | Admit: 2016-05-14 | Payer: 59 | Source: Ambulatory Visit

## 2016-05-15 NOTE — Assessment & Plan Note (Signed)
Likely due to smoking  - initial work up for polycythemia obtained  - pulse ox normal at rest and during walking throughout the clinic  - Obtained CXR, UA, CBC

## 2016-05-15 NOTE — Assessment & Plan Note (Signed)
1 ppd, not interested in quitting currently

## 2016-05-15 NOTE — Assessment & Plan Note (Addendum)
Regular rate currently. No issues with chest pain, dyspnea, swelling in lower extremities. CHADVASC score 0.  -Continue metoprolol 24 mg daily and Aspirin 81 mg - Obtain ECHO -Follow up with cardiology

## 2016-05-15 NOTE — Assessment & Plan Note (Signed)
Drinking half a pack daily  - provided resources on EtOH

## 2017-04-14 ENCOUNTER — Ambulatory Visit (INDEPENDENT_AMBULATORY_CARE_PROVIDER_SITE_OTHER): Payer: 59 | Admitting: Internal Medicine

## 2017-04-14 ENCOUNTER — Encounter: Payer: Self-pay | Admitting: Internal Medicine

## 2017-04-14 VITALS — BP 127/80 | HR 69 | Temp 97.7°F | Ht 71.0 in | Wt 228.6 lb

## 2017-04-14 DIAGNOSIS — I4891 Unspecified atrial fibrillation: Secondary | ICD-10-CM

## 2017-04-14 DIAGNOSIS — F1010 Alcohol abuse, uncomplicated: Secondary | ICD-10-CM

## 2017-04-14 DIAGNOSIS — F17200 Nicotine dependence, unspecified, uncomplicated: Secondary | ICD-10-CM

## 2017-04-14 MED ORDER — METOPROLOL SUCCINATE ER 25 MG PO TB24: 25.0000 mg | ORAL_TABLET | Freq: Every day | ORAL | 4 refills | Status: DC

## 2017-04-14 NOTE — Progress Notes (Signed)
   [facility] Noralee CharsAsiyah Mikell, MD Phone: [phone]  Reason For Visit: Physical exam  States he is here because his wife made him come.  Denies any concerns.  #current tobacco user He smokes about a pack and half a day.  Has been smoking for over 15 year.  He denies any interest in quitting currently  #Alcohol abuse -Patient drinks a 6 pack every day -On the weekends he will also drink more than that -He is interested in information on quitting  #Atrial fibrillation -Patient was seen by me last year for atrial fibrillation.  He denies having any palpitations since then.  He stopped the Lopressor which I started him on.  He never followed up with cardiology.  He did not get the echocardiogram as discussed.  Denies any chest pain, shortness of breath, palpitations, syncope.  Health maintenance -Patient not interested in getting flu vaccine or Tdap Past Medical History Reviewed problem list.  Medications- reviewed and updated No additions to family history Social history- patient is a smoker  ROS Patient reports no  vision/ hearing changes,anorexia, weight change, fever ,adenopathy, persistant / recurrent hoarseness, swallowing issues, chest pain, edema,persistant / recurrent cough, hemoptysis, dyspnea(rest, exertional, paroxysmal nocturnal), gastrointestinal  bleeding (melena, rectal bleeding), abdominal pain, excessive heart burn, GU symptoms(dysuria, hematuria, pyuria, voiding/incontinence  Issues) syncope, focal weakness, severe memory loss, concerning skin lesions, depression, anxiety, abnormal bruising/bleeding, major joint swelling.    Objective: BP 127/80 (BP Location: Right Arm, Patient Position: Sitting, Cuff Size: Normal)   Pulse 69   Temp 97.7 F (36.5 C) (Oral)   Ht 5\' 11"  (1.803 m)   Wt 228 lb 9.6 oz (103.7 kg)   SpO2 97%   BMI 31.88 kg/m  Gen: NAD, alert, cooperative with exam HEENT: Normal    Neck: No masses palpated. No lymphadenopathy    Ears: Tympanic membranes intact, normal light reflex, no erythema, no bulging    Eyes: PERRLA    Nose: nasal turbinates moist    Throat: moist mucus membranes, no erythema Cardio: regular rate and rhythm, S1S2 heard, no murmurs appreciated Pulm: clear to auscultation bilaterally, no wheezes, rhonchi or rales GI: soft, non-tender, non-distended, bowel sounds present, no hepatomegaly, no splenomegaly Extremities: warm, well perfused, No edema, cyanosis or clubbing;  MSK: Normal gait and station Skin: dry, intact, no rashes or lesions Neuro: Strength and sensation grossly intact  Assessment/Plan: See problem based a/p  Alcohol abuse  Discussed with patient that he is currently abusing alcohol, he understands this understands  -He is not sure that he wants to make any changes currently -However he is willing to receive information about the process of quitting -With the help of Gavin PoundDeborah provided a pamphlet of information-highlighted AAA to patient recommending this as the first step - Comprehensive metabolic panel - Lipid panel - CBC     TOBACCO USER Currently smoker Pre-contemplative Consider discussing quitting at next visit again  Atrial fibrillation (HCC) History of atrial fibrillation, not on beta-blocker for over a year.  No anticoagulation as other than 81 mg of aspirin given patient with chadsvas score of 0. Was supposed to see cardiology and get an echocardiogram.  He did not do this.  States he would like to do this now. - metoprolol succinate (TOPROL-XL) 25 MG 24 hr tablet; Take 1 tablet (25 mg total) by mouth daily.  Dispense: 30 tablet; Refill: 4 - Ambulatory referral to Cardiology - ECHOCARDIOGRAM COMPLETE; Future

## 2017-04-14 NOTE — Patient Instructions (Signed)
It was nice seeing you today.  I will get general blood work today.  I am going to place another echocardiogram to get your heart checked.  I will also place another referral to cardiology.  Please follow-up with them.  I am also going to refill your metoprolol please continue taking this medication.  Please follow-up with me in 6 months.  If there is any concerns on your blood work I will call you otherwise you will get the results in the mail.

## 2017-04-15 LAB — COMPREHENSIVE METABOLIC PANEL
A/G RATIO: 1.6 (ref 1.2–2.2)
ALBUMIN: 4.5 g/dL (ref 3.5–5.5)
ALT: 46 IU/L — ABNORMAL HIGH (ref 0–44)
AST: 49 IU/L — AB (ref 0–40)
Alkaline Phosphatase: 65 IU/L (ref 39–117)
BUN/Creatinine Ratio: 11 (ref 9–20)
BUN: 10 mg/dL (ref 6–24)
Bilirubin Total: 0.9 mg/dL (ref 0.0–1.2)
CALCIUM: 9.4 mg/dL (ref 8.7–10.2)
CO2: 25 mmol/L (ref 20–29)
CREATININE: 0.88 mg/dL (ref 0.76–1.27)
Chloride: 101 mmol/L (ref 96–106)
GFR, EST AFRICAN AMERICAN: 124 mL/min/{1.73_m2} (ref >59)
GFR, EST NON AFRICAN AMERICAN: 107 mL/min/{1.73_m2} (ref >59)
GLOBULIN, TOTAL: 2.9 g/dL (ref 1.5–4.5)
Glucose: 96 mg/dL (ref 65–99)
Potassium: 4.9 mmol/L (ref 3.5–5.2)
Sodium: 140 mmol/L (ref 134–144)
TOTAL PROTEIN: 7.4 g/dL (ref 6.0–8.5)

## 2017-04-15 LAB — LIPID PANEL
CHOL/HDL RATIO: 3.2 ratio (ref 0.0–5.0)
Cholesterol, Total: 198 mg/dL (ref 100–199)
HDL: 61 mg/dL (ref >39)
LDL CALC: 122 mg/dL — AB (ref 0–99)
TRIGLYCERIDES: 74 mg/dL (ref 0–149)
VLDL Cholesterol Cal: 15 mg/dL (ref 5–40)

## 2017-04-15 LAB — CBC
HEMOGLOBIN: 16.5 g/dL (ref 13.0–17.7)
Hematocrit: 47.6 % (ref 37.5–51.0)
MCH: 28.7 pg (ref 26.6–33.0)
MCHC: 34.7 g/dL (ref 31.5–35.7)
MCV: 83 fL (ref 79–97)
PLATELETS: 161 10*3/uL (ref 150–379)
RBC: 5.75 x10E6/uL (ref 4.14–5.80)
RDW: 13.7 % (ref 12.3–15.4)
WBC: 5.4 10*3/uL (ref 3.4–10.8)

## 2017-04-20 ENCOUNTER — Telehealth: Payer: Self-pay

## 2017-04-20 NOTE — Assessment & Plan Note (Addendum)
Discussed with patient that he is currently abusing alcohol, he understands this understands  -He is not sure that he wants to make any changes currently -However he is willing to receive information about the process of quitting -With the help of Gavin PoundDeborah provided a pamphlet of information-highlighted AAA to patient recommending this as the first step - Comprehensive metabolic panel - Lipid panel - CBC

## 2017-04-20 NOTE — Telephone Encounter (Signed)
Called patient's cell phone and spoke to his wife [name]. Gave him the information on his appointment for his echocardiogram on 04/29/2017 at 1400.[name], [name]

## 2017-04-20 NOTE — Assessment & Plan Note (Signed)
Currently smoker Pre-contemplative Consider discussing quitting at next visit again

## 2017-04-20 NOTE — Assessment & Plan Note (Signed)
History of atrial fibrillation, not on beta-blocker for over a year.  No anticoagulation as other than 81 mg of aspirin given patient with chadsvas score of 0. Was supposed to see cardiology and get an echocardiogram.  He did not do this.  States he would like to do this now. - metoprolol succinate (TOPROL-XL) 25 MG 24 hr tablet; Take 1 tablet (25 mg total) by mouth daily.  Dispense: 30 tablet; Refill: 4 - Ambulatory referral to Cardiology - ECHOCARDIOGRAM COMPLETE; Future

## 2017-04-29 ENCOUNTER — Ambulatory Visit (HOSPITAL_COMMUNITY): Admission: RE | Admit: 2017-04-29 | Payer: 59 | Source: Ambulatory Visit

## 2017-05-12 ENCOUNTER — Ambulatory Visit (HOSPITAL_COMMUNITY)
Admission: RE | Admit: 2017-05-12 | Discharge: 2017-05-12 | Disposition: A | Discharge status: Home / Self Care | Payer: 59 | Source: Ambulatory Visit | Attending: Family Medicine | Admitting: Family Medicine

## 2017-05-12 DIAGNOSIS — F1010 Alcohol abuse, uncomplicated: Secondary | ICD-10-CM | POA: Diagnosis not present

## 2017-05-12 DIAGNOSIS — I4891 Unspecified atrial fibrillation: Secondary | ICD-10-CM

## 2017-05-12 DIAGNOSIS — Z720 Tobacco use: Secondary | ICD-10-CM | POA: Insufficient documentation

## 2017-05-12 NOTE — Progress Notes (Signed)
  Echocardiogram 2D Echocardiogram has been performed.  Janalyn HarderWest, Mekayla Soman R 05/12/2017, 3:52 PM

## 2017-10-19 ENCOUNTER — Ambulatory Visit: Payer: 59 | Admitting: Internal Medicine

## 2017-10-22 ENCOUNTER — Ambulatory Visit: Payer: 59 | Admitting: Internal Medicine

## 2017-10-28 ENCOUNTER — Ambulatory Visit: Payer: 59 | Admitting: Internal Medicine

## 2018-04-25 ENCOUNTER — Inpatient Hospital Stay (HOSPITAL_COMMUNITY): Payer: 59

## 2018-04-25 ENCOUNTER — Inpatient Hospital Stay (HOSPITAL_COMMUNITY)
Admission: EM | Admit: 2018-04-25 | Discharge: 2018-05-05 | DRG: 021 | Disposition: A | Discharge status: Home / Self Care | Payer: 59 | Attending: Neurosurgery | Admitting: Neurosurgery

## 2018-04-25 ENCOUNTER — Encounter (HOSPITAL_COMMUNITY)
Admission: EM | Disposition: A | Discharge status: Home / Self Care | Payer: Self-pay | Source: Home / Self Care | Attending: Neurosurgery

## 2018-04-25 ENCOUNTER — Encounter (HOSPITAL_COMMUNITY): Payer: Self-pay | Admitting: Emergency Medicine

## 2018-04-25 ENCOUNTER — Inpatient Hospital Stay (HOSPITAL_COMMUNITY): Payer: 59 | Admitting: Anesthesiology

## 2018-04-25 ENCOUNTER — Emergency Department (HOSPITAL_COMMUNITY): Payer: 59

## 2018-04-25 DIAGNOSIS — I6032 Nontraumatic subarachnoid hemorrhage from left posterior communicating artery: Secondary | ICD-10-CM | POA: Diagnosis not present

## 2018-04-25 DIAGNOSIS — G919 Hydrocephalus, unspecified: Secondary | ICD-10-CM | POA: Diagnosis not present

## 2018-04-25 DIAGNOSIS — Z23 Encounter for immunization: Secondary | ICD-10-CM | POA: Diagnosis not present

## 2018-04-25 DIAGNOSIS — Z7982 Long term (current) use of aspirin: Secondary | ICD-10-CM | POA: Diagnosis not present

## 2018-04-25 DIAGNOSIS — H02402 Unspecified ptosis of left eyelid: Secondary | ICD-10-CM | POA: Diagnosis present

## 2018-04-25 DIAGNOSIS — I609 Nontraumatic subarachnoid hemorrhage, unspecified: Secondary | ICD-10-CM | POA: Diagnosis not present

## 2018-04-25 DIAGNOSIS — Z79899 Other long term (current) drug therapy: Secondary | ICD-10-CM

## 2018-04-25 DIAGNOSIS — I729 Aneurysm of unspecified site: Secondary | ICD-10-CM

## 2018-04-25 DIAGNOSIS — E7800 Pure hypercholesterolemia, unspecified: Secondary | ICD-10-CM | POA: Diagnosis present

## 2018-04-25 DIAGNOSIS — R402413 Glasgow coma scale score 13-15, at hospital admission: Secondary | ICD-10-CM | POA: Diagnosis present

## 2018-04-25 DIAGNOSIS — F17210 Nicotine dependence, cigarettes, uncomplicated: Secondary | ICD-10-CM | POA: Diagnosis present

## 2018-04-25 DIAGNOSIS — I4891 Unspecified atrial fibrillation: Secondary | ICD-10-CM | POA: Diagnosis present

## 2018-04-25 DIAGNOSIS — I608 Other nontraumatic subarachnoid hemorrhage: Secondary | ICD-10-CM

## 2018-04-25 HISTORY — PX: IR ANGIO VERTEBRAL SEL VERTEBRAL BILAT MOD SED: IMG5369

## 2018-04-25 HISTORY — PX: RADIOLOGY WITH ANESTHESIA: SHX6223

## 2018-04-25 HISTORY — PX: IR ANGIOGRAM FOLLOW UP STUDY: IMG697

## 2018-04-25 HISTORY — PX: IR ANGIO INTRA EXTRACRAN SEL INTERNAL CAROTID UNI L MOD SED: IMG5361

## 2018-04-25 HISTORY — PX: IR TRANSCATH/EMBOLIZ: IMG695

## 2018-04-25 HISTORY — PX: IR ANGIO INTRA EXTRACRAN SEL COM CAROTID INNOMINATE UNI R MOD SED: IMG5359

## 2018-04-25 LAB — CBC WITH DIFFERENTIAL/PLATELET
ABS IMMATURE GRANULOCYTES: 0.01 10*3/uL (ref 0.00–0.07)
BASOS ABS: 0.1 10*3/uL (ref 0.0–0.1)
Basophils Relative: 1 %
EOS ABS: 0.2 10*3/uL (ref 0.0–0.5)
EOS PCT: 4 %
HEMATOCRIT: 50.8 % (ref 39.0–52.0)
HEMOGLOBIN: 16.1 g/dL (ref 13.0–17.0)
IMMATURE GRANULOCYTES: 0 %
LYMPHS PCT: 38 %
Lymphs Abs: 1.7 10*3/uL (ref 0.7–4.0)
MCH: 27 pg (ref 26.0–34.0)
MCHC: 31.7 g/dL (ref 30.0–36.0)
MCV: 85.1 fL (ref 80.0–100.0)
Monocytes Absolute: 0.5 10*3/uL (ref 0.1–1.0)
Monocytes Relative: 11 %
NRBC: 0 % (ref 0.0–0.2)
Neutro Abs: 2 10*3/uL (ref 1.7–7.7)
Neutrophils Relative %: 46 %
Platelets: 176 10*3/uL (ref 150–400)
RBC: 5.97 MIL/uL — AB (ref 4.22–5.81)
RDW: 14.1 % (ref 11.5–15.5)
WBC: 4.3 10*3/uL (ref 4.0–10.5)

## 2018-04-25 LAB — POCT ACTIVATED CLOTTING TIME: Activated Clotting Time: 197 seconds

## 2018-04-25 LAB — I-STAT CHEM 8, ED
BUN: 7 mg/dL (ref 6–20)
CHLORIDE: 105 mmol/L (ref 98–111)
CREATININE: 0.8 mg/dL (ref 0.61–1.24)
Calcium, Ion: 1.03 mmol/L — ABNORMAL LOW (ref 1.15–1.40)
GLUCOSE: 107 mg/dL — AB (ref 70–99)
HCT: 52 % (ref 39.0–52.0)
Hemoglobin: 17.7 g/dL — ABNORMAL HIGH (ref 13.0–17.0)
POTASSIUM: 3.4 mmol/L — AB (ref 3.5–5.1)
Sodium: 139 mmol/L (ref 135–145)
TCO2: 22 mmol/L (ref 22–32)

## 2018-04-25 LAB — BASIC METABOLIC PANEL
Anion gap: 16 — ABNORMAL HIGH (ref 5–15)
BUN: 7 mg/dL (ref 6–20)
CALCIUM: 8.8 mg/dL — AB (ref 8.9–10.3)
CHLORIDE: 100 mmol/L (ref 98–111)
CO2: 22 mmol/L (ref 22–32)
CREATININE: 0.8 mg/dL (ref 0.61–1.24)
Glucose, Bld: 109 mg/dL — ABNORMAL HIGH (ref 70–99)
Potassium: 3.4 mmol/L — ABNORMAL LOW (ref 3.5–5.1)
SODIUM: 138 mmol/L (ref 135–145)

## 2018-04-25 LAB — RAPID URINE DRUG SCREEN, HOSP PERFORMED
Amphetamines: NOT DETECTED
Barbiturates: NOT DETECTED
Benzodiazepines: NOT DETECTED
Cocaine: NOT DETECTED
Opiates: NOT DETECTED
Tetrahydrocannabinol: POSITIVE — AB

## 2018-04-25 LAB — PROTIME-INR
INR: 1.08
Prothrombin Time: 13.9 seconds (ref 11.4–15.2)

## 2018-04-25 LAB — APTT: aPTT: 28 seconds (ref 24–36)

## 2018-04-25 LAB — MRSA PCR SCREENING: MRSA by PCR: NEGATIVE

## 2018-04-25 SURGERY — IR WITH ANESTHESIA
Anesthesia: General

## 2018-04-25 MED ORDER — ACETAMINOPHEN 160 MG/5ML PO SOLN: 650.0000 mg | ORAL | Status: DC | PRN

## 2018-04-25 MED ORDER — IOPAMIDOL (ISOVUE-370) INJECTION 76%
INTRAVENOUS | Status: AC
  Filled 2018-04-25: qty 100

## 2018-04-25 MED ORDER — SUGAMMADEX SODIUM 200 MG/2ML IV SOLN
INTRAVENOUS | Status: DC | PRN
  Administered 2018-04-25: 200 mg via INTRAVENOUS

## 2018-04-25 MED ORDER — NITROGLYCERIN 1 MG/10 ML FOR IR/CATH LAB
INTRA_ARTERIAL | Status: AC
  Filled 2018-04-25: qty 10

## 2018-04-25 MED ORDER — HYDROMORPHONE HCL 1 MG/ML IJ SOLN
0.5000 mg | Freq: Once | INTRAMUSCULAR | Status: AC
  Administered 2018-04-25: 0.5 mg via INTRAVENOUS
  Filled 2018-04-25: qty 1

## 2018-04-25 MED ORDER — DEXAMETHASONE SODIUM PHOSPHATE 10 MG/ML IJ SOLN
INTRAMUSCULAR | Status: DC | PRN
  Administered 2018-04-25: 4 mg via INTRAVENOUS

## 2018-04-25 MED ORDER — HEPARIN SODIUM (PORCINE) 1000 UNIT/ML IJ SOLN
INTRAMUSCULAR | Status: DC | PRN
  Administered 2018-04-25: 1000 [IU] via INTRAVENOUS
  Administered 2018-04-25: 2000 [IU] via INTRAVENOUS
  Administered 2018-04-25: 1000 [IU] via INTRAVENOUS

## 2018-04-25 MED ORDER — NIMODIPINE 60 MG/20ML PO SOLN
60.0000 mg | ORAL | Status: DC
  Filled 2018-04-25 (×3): qty 20

## 2018-04-25 MED ORDER — PROCHLORPERAZINE EDISYLATE 10 MG/2ML IJ SOLN
10.0000 mg | Freq: Once | INTRAMUSCULAR | Status: AC
  Administered 2018-04-25: 10 mg via INTRAVENOUS
  Filled 2018-04-25: qty 2

## 2018-04-25 MED ORDER — IOHEXOL 300 MG/ML  SOLN
300.0000 mL | Freq: Once | INTRAMUSCULAR | Status: AC | PRN
  Administered 2018-04-25: 200 mL via INTRA_ARTERIAL

## 2018-04-25 MED ORDER — CEFAZOLIN SODIUM-DEXTROSE 2-3 GM-%(50ML) IV SOLR
INTRAVENOUS | Status: DC | PRN
  Administered 2018-04-25: 2 g via INTRAVENOUS

## 2018-04-25 MED ORDER — CLEVIDIPINE BUTYRATE 0.5 MG/ML IV EMUL
INTRAVENOUS | Status: DC | PRN
  Administered 2018-04-25: 2 mg/h via INTRAVENOUS

## 2018-04-25 MED ORDER — CLEVIDIPINE BUTYRATE 0.5 MG/ML IV EMUL
0.0000 mg/h | INTRAVENOUS | Status: AC
  Administered 2018-04-25: 8 mg/h via INTRAVENOUS
  Administered 2018-04-26: 16 mg/h via INTRAVENOUS
  Administered 2018-04-26: 17 mg/h via INTRAVENOUS
  Administered 2018-04-26: 16 mg/h via INTRAVENOUS
  Filled 2018-04-25 (×5): qty 50

## 2018-04-25 MED ORDER — STROKE: EARLY STAGES OF RECOVERY BOOK
Freq: Once | Status: DC
  Filled 2018-04-25: qty 1

## 2018-04-25 MED ORDER — PANTOPRAZOLE SODIUM 40 MG PO PACK
40.0000 mg | PACK | Freq: Every day | ORAL | Status: DC
  Filled 2018-04-25: qty 20

## 2018-04-25 MED ORDER — FENTANYL CITRATE (PF) 100 MCG/2ML IJ SOLN
INTRAMUSCULAR | Status: DC | PRN
  Administered 2018-04-25 (×2): 50 ug via INTRAVENOUS
  Administered 2018-04-25 (×2): 25 ug via INTRAVENOUS
  Administered 2018-04-25: 50 ug via INTRAVENOUS
  Administered 2018-04-25: 25 ug via INTRAVENOUS
  Administered 2018-04-25: 50 ug via INTRAVENOUS
  Administered 2018-04-25: 125 ug via INTRAVENOUS

## 2018-04-25 MED ORDER — ROCURONIUM BROMIDE 10 MG/ML (PF) SYRINGE
PREFILLED_SYRINGE | INTRAVENOUS | Status: DC | PRN
  Administered 2018-04-25: 20 mg via INTRAVENOUS
  Administered 2018-04-25: 40 mg via INTRAVENOUS
  Administered 2018-04-25: 10 mg via INTRAVENOUS
  Administered 2018-04-25: 20 mg via INTRAVENOUS
  Administered 2018-04-25: 60 mg via INTRAVENOUS

## 2018-04-25 MED ORDER — NIMODIPINE 30 MG PO CAPS
60.0000 mg | ORAL_CAPSULE | ORAL | Status: DC
  Administered 2018-04-25 – 2018-05-02 (×44): 60 mg via ORAL
  Filled 2018-04-25 (×45): qty 2

## 2018-04-25 MED ORDER — LABETALOL HCL 5 MG/ML IV SOLN
INTRAVENOUS | Status: DC | PRN
  Administered 2018-04-25 (×2): 5 mg via INTRAVENOUS
  Administered 2018-04-25: 2.5 mg via INTRAVENOUS
  Administered 2018-04-25: 5 mg via INTRAVENOUS
  Administered 2018-04-25: 2.5 mg via INTRAVENOUS
  Administered 2018-04-25: 5 mg via INTRAVENOUS

## 2018-04-25 MED ORDER — IOPAMIDOL (ISOVUE-370) INJECTION 76%: 100.0000 mL | Freq: Once | INTRAVENOUS | Status: DC

## 2018-04-25 MED ORDER — LIDOCAINE HCL 1 % IJ SOLN
INTRAMUSCULAR | Status: AC
  Filled 2018-04-25: qty 20

## 2018-04-25 MED ORDER — IOPAMIDOL (ISOVUE-300) INJECTION 61%
INTRAVENOUS | Status: AC
  Filled 2018-04-25: qty 100

## 2018-04-25 MED ORDER — OXYCODONE HCL 5 MG/5ML PO SOLN: 5.0000 mg | Freq: Once | ORAL | Status: DC | PRN

## 2018-04-25 MED ORDER — PANTOPRAZOLE SODIUM 40 MG PO TBEC
40.0000 mg | DELAYED_RELEASE_TABLET | Freq: Every day | ORAL | Status: DC
  Administered 2018-04-26 – 2018-05-05 (×10): 40 mg via ORAL
  Filled 2018-04-25 (×10): qty 1

## 2018-04-25 MED ORDER — PHENYLEPHRINE 40 MCG/ML (10ML) SYRINGE FOR IV PUSH (FOR BLOOD PRESSURE SUPPORT)
PREFILLED_SYRINGE | INTRAVENOUS | Status: DC | PRN
  Administered 2018-04-25: 40 ug via INTRAVENOUS

## 2018-04-25 MED ORDER — ACETAMINOPHEN 325 MG PO TABS
650.0000 mg | ORAL_TABLET | ORAL | Status: DC | PRN
  Administered 2018-04-25 – 2018-05-05 (×4): 650 mg via ORAL
  Filled 2018-04-25 (×4): qty 2

## 2018-04-25 MED ORDER — ACETAMINOPHEN 650 MG RE SUPP: 650.0000 mg | RECTAL | Status: DC | PRN

## 2018-04-25 MED ORDER — LIDOCAINE 2% (20 MG/ML) 5 ML SYRINGE
INTRAMUSCULAR | Status: DC | PRN
  Administered 2018-04-25: 100 mg via INTRAVENOUS

## 2018-04-25 MED ORDER — NICARDIPINE HCL IN NACL 20-0.86 MG/200ML-% IV SOLN
0.0000 mg/h | INTRAVENOUS | Status: DC
  Filled 2018-04-25: qty 200

## 2018-04-25 MED ORDER — PROPOFOL 10 MG/ML IV BOLUS
INTRAVENOUS | Status: DC | PRN
  Administered 2018-04-25: 20 mg via INTRAVENOUS
  Administered 2018-04-25: 30 mg via INTRAVENOUS
  Administered 2018-04-25: 20 mg via INTRAVENOUS
  Administered 2018-04-25: 200 mg via INTRAVENOUS

## 2018-04-25 MED ORDER — ONDANSETRON 4 MG PO TBDP: 4.0000 mg | ORAL_TABLET | Freq: Four times a day (QID) | ORAL | Status: DC | PRN

## 2018-04-25 MED ORDER — CEFAZOLIN SODIUM-DEXTROSE 2-4 GM/100ML-% IV SOLN
INTRAVENOUS | Status: AC
  Filled 2018-04-25: qty 100

## 2018-04-25 MED ORDER — ACETAMINOPHEN 325 MG PO TABS: 650.0000 mg | ORAL_TABLET | ORAL | Status: DC | PRN

## 2018-04-25 MED ORDER — LABETALOL HCL 5 MG/ML IV SOLN
INTRAVENOUS | Status: AC
  Filled 2018-04-25: qty 4

## 2018-04-25 MED ORDER — ROCURONIUM BROMIDE 50 MG/5ML IV SOSY
PREFILLED_SYRINGE | INTRAVENOUS | Status: AC
  Filled 2018-04-25: qty 5

## 2018-04-25 MED ORDER — ONDANSETRON HCL 4 MG/2ML IJ SOLN: 4.0000 mg | Freq: Once | INTRAMUSCULAR | Status: DC | PRN

## 2018-04-25 MED ORDER — DEXAMETHASONE SODIUM PHOSPHATE 10 MG/ML IJ SOLN
INTRAMUSCULAR | Status: AC
  Filled 2018-04-25: qty 1

## 2018-04-25 MED ORDER — ONDANSETRON HCL 4 MG/2ML IJ SOLN: 4.0000 mg | Freq: Four times a day (QID) | INTRAMUSCULAR | Status: DC | PRN

## 2018-04-25 MED ORDER — LACTATED RINGERS IV SOLN
INTRAVENOUS | Status: DC | PRN
  Administered 2018-04-25 (×2): via INTRAVENOUS

## 2018-04-25 MED ORDER — SODIUM CHLORIDE 0.9 % IV SOLN
INTRAVENOUS | Status: DC
  Administered 2018-04-26 – 2018-05-02 (×13): via INTRAVENOUS

## 2018-04-25 MED ORDER — CEFAZOLIN SODIUM 1 G IJ SOLR
INTRAMUSCULAR | Status: AC
  Filled 2018-04-25: qty 20

## 2018-04-25 MED ORDER — ONDANSETRON HCL 4 MG/2ML IJ SOLN
INTRAMUSCULAR | Status: DC | PRN
  Administered 2018-04-25: 4 mg via INTRAVENOUS

## 2018-04-25 MED ORDER — SODIUM CHLORIDE 0.9 % IV BOLUS
1000.0000 mL | Freq: Once | INTRAVENOUS | Status: AC
  Administered 2018-04-25: 1000 mL via INTRAVENOUS

## 2018-04-25 MED ORDER — METOPROLOL TARTRATE 5 MG/5ML IV SOLN
INTRAVENOUS | Status: AC
  Filled 2018-04-25: qty 5

## 2018-04-25 MED ORDER — ESMOLOL HCL 100 MG/10ML IV SOLN
INTRAVENOUS | Status: DC | PRN
  Administered 2018-04-25: 10 mg via INTRAVENOUS
  Administered 2018-04-25: 20 mg via INTRAVENOUS
  Administered 2018-04-25: 30 mg via INTRAVENOUS
  Administered 2018-04-25 (×2): 20 mg via INTRAVENOUS

## 2018-04-25 MED ORDER — IOPAMIDOL (ISOVUE-370) INJECTION 76%
75.0000 mL | Freq: Once | INTRAVENOUS | Status: AC | PRN
  Administered 2018-04-25: 100 mL via INTRAVENOUS

## 2018-04-25 MED ORDER — FENTANYL CITRATE (PF) 100 MCG/2ML IJ SOLN: 25.0000 ug | INTRAMUSCULAR | Status: DC | PRN

## 2018-04-25 MED ORDER — DOCUSATE SODIUM 100 MG PO CAPS
100.0000 mg | ORAL_CAPSULE | Freq: Two times a day (BID) | ORAL | Status: DC
  Administered 2018-04-25 – 2018-05-05 (×17): 100 mg via ORAL
  Filled 2018-04-25 (×18): qty 1

## 2018-04-25 MED ORDER — ESMOLOL HCL 100 MG/10ML IV SOLN
INTRAVENOUS | Status: AC
  Filled 2018-04-25: qty 10

## 2018-04-25 MED ORDER — OXYCODONE HCL 5 MG PO TABS: 5.0000 mg | ORAL_TABLET | Freq: Once | ORAL | Status: DC | PRN

## 2018-04-25 MED ORDER — DIPHENHYDRAMINE HCL 50 MG/ML IJ SOLN
25.0000 mg | Freq: Once | INTRAMUSCULAR | Status: AC
Start: 2018-04-25 — End: 2018-04-25
  Administered 2018-04-25: 25 mg via INTRAVENOUS
  Filled 2018-04-25: qty 1

## 2018-04-25 MED ORDER — SODIUM CHLORIDE 0.9 % IV SOLN
INTRAVENOUS | Status: DC
  Administered 2018-04-25 – 2018-04-26 (×2): via INTRAVENOUS

## 2018-04-25 NOTE — Anesthesia Procedure Notes (Signed)
Procedure Name: Intubation Performed by: Milford Cage, CRNA Pre-anesthesia Checklist: Patient identified, Emergency Drugs available, Suction available and Patient being monitored Patient Re-evaluated:Patient Re-evaluated prior to induction Oxygen Delivery Method: Circle System Utilized Preoxygenation: Pre-oxygenation with 100% oxygen Induction Type: IV induction Ventilation: Mask ventilation without difficulty and Oral airway inserted - appropriate to patient size Laryngoscope Size: Mac and 4 Grade View: Grade I Tube type: Oral Tube size: 7.5 mm Number of attempts: 1 Airway Equipment and Method: Stylet and Oral airway Placement Confirmation: ETT inserted through vocal cords under direct vision,  positive ETCO2 and breath sounds checked- equal and bilateral Secured at: 23 cm Tube secured with: Tape Dental Injury: Teeth and Oropharynx as per pre-operative assessment

## 2018-04-25 NOTE — Procedures (Signed)
S/P 4 vessel cerebral arteriogram RT CFA approach. Findings. 1.approx 9.55mmx 12 mm x 18 mm LtICA post wall intracranial aneurysm . 2. S/P primary coiling with near complete obliteration of the aneurysm with a small neck remnant with stasis.

## 2018-04-25 NOTE — Sedation Documentation (Signed)
Patient arrived to IR suite 2. Patient under care of CRNA with RN present in room to assist.

## 2018-04-25 NOTE — Progress Notes (Signed)
Pt arrive to unit at approximately 1630.

## 2018-04-25 NOTE — Sedation Documentation (Signed)
Report given to PACU RN. Groin site and pulses checked with PACU RN

## 2018-04-25 NOTE — ED Provider Notes (Signed)
[facility] EMERGENCY DEPARTMENT Provider Note   CSN: [id number] Arrival date & time: 04/25/18  4098     History   Chief Complaint Chief Complaint  Patient presents with  . Headache    HPI Clarence Larsen is a 42 y.o. male.  Clarence Larsen is a 43 y.o. Male with a history of A fib, low back pain, tobacco use and alcohol use, who presents to the emergency department for evaluation of sudden onset headache.  Patient reports he was having intercourse with his wife this morning when he suddenly developed a severe headache.  His wife reports that he stood up had a glazed over look on his face and then seemed to pass out he did not fall completely to the ground or hit his head.  She reports she asked him if he was okay and he started complaining of pain in his head.  Reports the headache was sudden in onset and severe.  History of prior similar headaches.  He reports since then he is continued to have throbbing constant pain and had one episode of vomiting while driving over here.  He denies any associated vision changes, does report some light sensitivity.  Reports mild lightheadedness, no further syncopal episodes.  Facial asymmetry or change in speech, no numbness weakness or tingling in the arms or legs.  No chest pain or shortness of breath, no neck pain or stiffness.  No abdominal pain.  He has not taken anything for headache prior to arrival.     History reviewed. No pertinent past medical history.  Patient Active Problem List   Diagnosis Date Noted  . Alcohol abuse 05/08/2016  . Atrial fibrillation (HCC) 04/23/2016  . Low back pain 01/12/2012  . TOBACCO USER 06/17/2009  . ELEVATED BP READING WITHOUT DX HYPERTENSION 06/17/2009    History reviewed. No pertinent surgical history.      Home Medications    Prior to Admission medications   Medication Sig Start Date End Date Taking? Authorizing Provider  acetaminophen (TYLENOL) 500 MG tablet Take 1,000 mg by  mouth every 6 (six) hours as needed. pain    [provider]  aspirin 81 MG chewable tablet Chew 1 tablet (81 mg total) by mouth daily. 04/23/16   McKeag, Janine Ores, MD  metoprolol succinate (TOPROL-XL) 25 MG 24 hr tablet Take 1 tablet (25 mg total) by mouth daily. 04/14/17   Mikell, Antionette Poles, MD  traMADol (ULTRAM) 50 MG tablet Take 1 tablet (50 mg total) by mouth every 6 (six) hours as needed. 02/13/15   Latrelle Dodrill, MD    Family History Family History  Problem Relation Age of Onset  . Cancer Father        Spleen cancer on chemo    Social History Social History   Tobacco Use  . Smoking status: Current Every Day Smoker    Packs/day: 0.00    Types: Cigarettes  . Smokeless tobacco: Never Used  Substance Use Topics  . Alcohol use: Yes  . Drug use: No     Allergies   Patient has no known allergies.   Review of Systems Review of Systems  Constitutional: Negative for chills and fever.  HENT: Negative.   Eyes: Negative for pain and visual disturbance.  Respiratory: Negative for cough and shortness of breath.   Cardiovascular: Negative for chest pain.  Gastrointestinal: Positive for nausea and vomiting. Negative for abdominal pain and diarrhea.  Genitourinary: Negative for dysuria, flank pain and frequency.  Musculoskeletal: Negative  for arthralgias, back pain, myalgias and neck pain.  Skin: Negative for color change and rash.  Neurological: Positive for syncope, light-headedness and headaches. Negative for dizziness, tremors, seizures, facial asymmetry, speech difficulty, weakness and numbness.     Physical Exam Updated Vital Signs BP 136/90   Pulse 85   Resp (!) 24   Ht 5\' 11"  (1.803 m)   Wt 104.3 kg   SpO2 95%   BMI 32.08 kg/m   Physical Exam  Constitutional: He is oriented to person, place, and time. He appears well-developed and well-nourished. No distress.  Patient appears uncomfortable but is in no acute distress  HENT:  Head: Normocephalic  and atraumatic.  Mouth/Throat: Oropharynx is clear and moist.  Eyes: Pupils are equal, round, and reactive to light. EOM are normal. Right eye exhibits no discharge. Left eye exhibits no discharge. Right eye exhibits no nystagmus. Left eye exhibits no nystagmus.  Neck: Normal range of motion. Neck supple. No neck rigidity.  No nuchal rigidity  Cardiovascular: Normal rate, regular rhythm, normal heart sounds and intact distal pulses. Exam reveals no gallop and no friction rub.  No murmur heard. Pulmonary/Chest: Effort normal and breath sounds normal. No respiratory distress.  Respirations equal and unlabored, patient able to speak in full sentences, lungs clear to auscultation bilaterally  Abdominal: Soft. Bowel sounds are normal. He exhibits no distension and no mass. There is no tenderness. There is no guarding.  Abdomen soft, nondistended, nontender to palpation in all quadrants without guarding or peritoneal signs  Neurological: He is alert and oriented to person, place, and time. Coordination normal. GCS eye subscore is 4. GCS verbal subscore is 5. GCS motor subscore is 6.  Speech is clear, able to follow commands CN III-XII intact Normal strength in upper and lower extremities bilaterally including dorsiflexion and plantar flexion, strong and equal grip strength Sensation normal to light and sharp touch Moves extremities without ataxia, coordination intact Normal finger to nose and rapid alternating movements No pronator drift  Skin: Skin is warm and dry. Capillary refill takes less than 2 seconds. He is not diaphoretic.  Psychiatric: He has a normal mood and affect. His behavior is normal.  Nursing note and vitals reviewed.    ED Treatments / Results  Labs (all labs ordered are listed, but only abnormal results are displayed) Labs Reviewed  BASIC METABOLIC PANEL - Abnormal; Notable for the following components:      Result Value   Potassium 3.4 (*)    Glucose, Bld 109 (*)     Calcium 8.8 (*)    Anion gap 16 (*)    All other components within normal limits  CBC WITH DIFFERENTIAL/PLATELET - Abnormal; Notable for the following components:   RBC 5.97 (*)    All other components within normal limits  I-STAT CHEM 8, ED - Abnormal; Notable for the following components:   Potassium 3.4 (*)    Glucose, Bld 107 (*)    Calcium, Ion 1.03 (*)    Hemoglobin 17.7 (*)    All other components within normal limits  HIV ANTIBODY (ROUTINE TESTING W REFLEX)  RAPID URINE DRUG SCREEN, HOSP PERFORMED    EKG None  Radiology Ct Angio Head W Or Wo Contrast  Result Date: 04/25/2018 CLINICAL DATA:  Subarachnoid hemorrhage. EXAM: CT ANGIOGRAPHY HEAD TECHNIQUE: Multidetector CT imaging of the head was performed using the standard protocol during bolus administration of intravenous contrast. Multiplanar CT image reconstructions and MIPs were obtained to evaluate the vascular anatomy. CONTRAST:  ISOVUE-370 IOPAMIDOL (ISOVUE-370) INJECTION 76% COMPARISON:  Head CT 04/25/2018.  No prior angiographic imaging. FINDINGS: Anterior circulation: The internal carotid arteries are widely patent from the included cervical portions through the termini. The distal left cervical ICA is tortuous. There is a wide-neck aneurysm projecting posteriorly from the supraclinoid left ICA in the posterior communicating region measuring 16 x 9 x 11 mm. ACAs and MCAs are patent without evidence of proximal branch occlusion or flow limiting proximal stenosis. Mild diffuse branch vessel irregularity is considered artifactual. Posterior circulation: The visualized distal vertebral arteries are widely patent to the basilar and codominant. Right PICA and bilateral SCA is are grossly patent. The basilar artery and PCAs are patent with mild diffuse irregularity which may be artifactual. Venous sinuses: Limited assessment due to arterial dominant contrast timing. Grossly patent superior sagittal and straight sinuses. Anatomic  variants: None. Delayed phase: Not performed. IMPRESSION: 1.6 cm left posterior communicating region aneurysm. Electronically Signed   By: Sebastian Ache M.D.   On: 04/25/2018 09:54   Ct Head Wo Contrast  Result Date: 04/25/2018 CLINICAL DATA:  42 year old male with severe headache, syncopal episode after sexual intercourse this morning. EXAM: CT HEAD WITHOUT CONTRAST TECHNIQUE: Contiguous axial images were obtained from the base of the skull through the vertex without intravenous contrast. COMPARISON:  None. FINDINGS: Brain: Moderate volume hyperdense subarachnoid hemorrhage primarily involving the basilar cisterns. Slightly more blood in the left ambient cistern, and prominent posterior fossa SAH. Associated small volume of intraventricular hemorrhage most pronounced in the 3rd and 4th ventricles. Mild generalized ventriculomegaly. No acute cortically based infarct. No midline shift or other extra-axial blood or fluid collection. Vascular: No discrete abnormality. Skull: Negative. Sinuses/Orbits: Ethmoid mucosal thickening, other visible paranasal sinuses tympanic cavities and mastoids are well pneumatized. Other: Visualized orbits and scalp soft tissues are within normal limits. IMPRESSION: 1. Intracranial Aneurysm Rupture suspected with basilar cistern predominant Subarachnoid Hemorrhage and small volume intraventricular blood. 2. Mild ventriculomegaly. 3. Critical Value/emergent results were called by telephone at the time of interpretation on 04/25/2018 at 0813 hours to PA [facility] in the ED, who verbally acknowledged these results. Electronically Signed   By: Odessa Fleming M.D.   On: 04/25/2018 08:21    Procedures .Critical Care Performed by: Dartha Lodge, PA-C Authorized by: Dartha Lodge, PA-C   Critical care provider statement:    Critical care time (minutes):  45   Critical care time was exclusive of:  Separately billable procedures and treating other patients   Critical care was necessary to  treat or prevent imminent or life-threatening deterioration of the following conditions:  CNS failure or compromise (Subarachnoid hemorrhage)   Critical care was time spent personally by me on the following activities:  Discussions with consultants, evaluation of patient's response to treatment, examination of patient, ordering and performing treatments and interventions, ordering and review of laboratory studies, ordering and review of radiographic studies, pulse oximetry, re-evaluation of patient's condition, obtaining history from patient or surrogate and review of old charts   I assumed direction of critical care for this patient from another provider in my specialty: no     (including critical care time)  Medications Ordered in ED Medications  iopamidol (ISOVUE-370) 76 % injection 100 mL (has no administration in time range)  iopamidol (ISOVUE-370) 76 % injection (has no administration in time range)  nicardipine (CARDENE) 20mg  in 0.86% saline IV infusion (0.1 mg/ml) (has no administration in time range)  sodium chloride 0.9 % bolus 1,000 mL (  0 mLs Intravenous Stopped 04/25/18 0846)  prochlorperazine (COMPAZINE) injection 10 mg (10 mg Intravenous Given 04/25/18 0718)  diphenhydrAMINE (BENADRYL) injection 25 mg (25 mg Intravenous Given 04/25/18 0718)  iopamidol (ISOVUE-370) 76 % injection 75 mL (100 mLs Intravenous Contrast Given 04/25/18 0912)  HYDROmorphone (DILAUDID) injection 0.5 mg (0.5 mg Intravenous Given 04/25/18 0906)     Initial Impression / Assessment and Plan / ED Course  I have reviewed the triage vital signs and the nursing notes.  Pertinent labs & imaging results that were available during my care of the patient were reviewed by me and considered in my medical decision making (see chart for details).  Patient presents with sudden onset severe headache during intercourse.  No history of similar headaches.  Afterwards he stood up and had a syncopal episode, since then has  complained of persistent throbbing headache and had one episode of vomiting while on the way to the ED.  On arrival he is mildly hypertensive but vitals are otherwise normal.  He is alert and oriented with no focal neurologic deficits on exam.  While exam is reassuring presentation and story very concerning for subarachnoid hemorrhage or aneurysm rupture.  Will get CT angio of the head as well as basic labs, and give Compazine and Benadryl for headache.  I-STAT Chem-8 shows normal creatinine, no other focal deficits, hemoglobin is elevated suggesting hemoconcentration.  Okay to proceed with scan.  8:13 AM called by radiology regarding results of head CT which shows subarachnoid hemorrhage with concern for ruptured aneurysm in the basilar region with some intraventricular hemorrhage and ventriculomegaly.  We will hold off on completing contrasted CT angios until discussing with neurosurgery.  Neurosurgery consult placed.  8:23 AM Case discussed with NP Hildred Priest with neurosurgery who is on her way in to evaluate the patient but recommends CT angios of the head, and starting Cardene drip for close blood pressure control maintaining SBP between 100-140  Neurosurgery at bedside evaluating patient, requesting one 0.5 mg dose of Dilaudid for pain, will plan for admission to neurosurgery service with interventional radiology consult for intravascular coiling versus craniotomy with aneurysm clipping.  Final Clinical Impressions(s) / ED Diagnoses   Final diagnoses:  Subarachnoid hemorrhage due to ruptured aneurysm [facility])    ED Discharge Orders    None       Dartha Lodge, [place] 04/25/18 1001    Gilda Crease, MD 04/25/18 2306

## 2018-04-25 NOTE — Anesthesia Preprocedure Evaluation (Addendum)
Anesthesia Evaluation  Patient identified by MRN, date of birth, ID band Patient awake    Reviewed: Allergy & Precautions, NPO status , Patient's Chart, lab work & pertinent test results  History of Anesthesia Complications Negative for: history of anesthetic complications  Airway Mallampati: II  TM Distance: >3 FB Neck ROM: Full    Dental no notable dental hx.    Pulmonary Current Smoker,    Pulmonary exam normal        Cardiovascular Normal cardiovascular exam+ dysrhythmias Atrial Fibrillation  Rhythm:Regular Rate:Normal  TTE 2018: - LVEF 55-60%, mild LVH, normal wall motion, grade 1 DD,   indeterminate LV filling pressure, dilated aortic root to 3.9 cm,   mild RAE, aneurysmal IAS, cannot exclude PFO, normal IVC.   Neuro/Psych CVA (acute SAH 2/2/ ruptured aneurysm) negative psych ROS   GI/Hepatic negative GI ROS, Neg liver ROS, (+)     substance abuse  alcohol use and marijuana use,   Endo/Other  negative endocrine ROS  Renal/GU negative Renal ROS  negative genitourinary   Musculoskeletal negative musculoskeletal ROS (+)   Abdominal   Peds  Hematology negative hematology ROS (+)   Anesthesia Other Findings   Reproductive/Obstetrics                            Anesthesia Physical Anesthesia Plan  ASA: IV and emergent  Anesthesia Plan: General   Post-op Pain Management:    Induction: Intravenous  PONV Risk Score and Plan: 1 and Ondansetron, Dexamethasone, Midazolam and Treatment may vary due to age or medical condition  Airway Management Planned: Oral ETT  Additional Equipment: Arterial line  Intra-op Plan:   Post-operative Plan: Extubation in OR and Possible Post-op intubation/ventilation  Informed Consent: I have reviewed the patients History and Physical, chart, labs and discussed the procedure including the risks, benefits and alternatives for the proposed anesthesia  with the patient or authorized representative who has indicated his/her understanding and acceptance.     Plan Discussed with:   Anesthesia Plan Comments:         Anesthesia Quick Evaluation

## 2018-04-25 NOTE — ED Triage Notes (Addendum)
Reports having intercourse with his wife this morning.  Passed out and had to be picked up by wife.  Now c/o pain in head that radiates down spine.  According to wife patient stood up got wobbly and fell down.

## 2018-04-25 NOTE — Sedation Documentation (Signed)
DR Corliss Skainseveshwar has entered IR suite 2. Patient remains under care of CRNA with this RN present in room.

## 2018-04-25 NOTE — Progress Notes (Signed)
Patient ID: [name], male   DOB: 09/20/1975, 42 y.o.   MRN: [id number] INR. Patient extubated without difficulty. Agitated though moving all 4 equally. Pupils 3mm RT = Lt. RT groin soft. Distal pulses:  palpable  DPs and PTs bilaterally unchanged.. S..Clarence Musich MD

## 2018-04-25 NOTE — Progress Notes (Signed)
RN verified the presence of a signed informed consent that matches stated procedure by patient. Verified armband matches patient's stated name and birth date. Verified NPO status (2000 on 04/24/18 for food; liquids sips this morning) and that all jewelry, contact, glasses, dentures, and partials had been removed (if applicable).

## 2018-04-25 NOTE — H&P (Addendum)
Subjective:   Patient is a 42 y.o. male who presents with sudden onset of a severe headache. The onset of symptoms was abrupt, starting a few hours ago with an unchanged course since that time. Wife reports, they were having intercourse when he seemed to lose consciousness. He did not fall or hit his head, but complained of a severe headache. The pain is a generalized headache. He describes it as a throbbing, constant pain and rates it a 9 / 10. The patient vomited on the way to the emergency department. He denies any changes in vision, but does report some photosensitivity. He has not had any further episodes of vomiting or syncope. He denies neck pain or stiffness. His wife reports he did not develop any facial asymmetry, change in speech, or numbness/weakness in his arms or legs. He denies SOB or chest pain.  Patient Active Problem List   Diagnosis Date Noted  . Subarachnoid hemorrhage (HCC) 04/25/2018  . Alcohol abuse 05/08/2016  . Atrial fibrillation (HCC) 04/23/2016  . Low back pain 01/12/2012  . TOBACCO USER 06/17/2009  . ELEVATED BP READING WITHOUT DX HYPERTENSION 06/17/2009   History reviewed. No pertinent past medical history.  History reviewed. No pertinent surgical history.   (Not in a hospital admission) No Known Allergies  Social History   Tobacco Use  . Smoking status: Current Every Day Smoker    Packs/day: 0.00    Types: Cigarettes  . Smokeless tobacco: Never Used  Substance Use Topics  . Alcohol use: Yes    Family History  Problem Relation Age of Onset  . Cancer Father        Spleen cancer on chemo    Review of Systems Pertinent items noted in HPI and remainder of comprehensive ROS otherwise negative.  Objective:   Patient Vitals for the past 8 hrs:  BP Pulse Resp SpO2 Height Weight  04/25/18 0915 127/87 83 (!) 22 91 % - -  04/25/18 0853 (!) 136/94 (!) 103 (!) 22 97 % - -  04/25/18 0836 (!) 138/99 96 (!) 26 97 % - -  04/25/18 0700 (!) 145/100 86 - 96 % - -   04/25/18 0645 (!) 145/98 88 - 96 % - -  04/25/18 1610 - - - - 5\' 11"  (1.803 m) 104.3 kg   No intake/output data recorded. Total I/O In: 1000 [IV Piggyback:1000] Out: -  Ct Angio Head W Or Wo Contrast  Result Date: 04/25/2018 CLINICAL DATA:  Subarachnoid hemorrhage. EXAM: CT ANGIOGRAPHY HEAD TECHNIQUE: Multidetector CT imaging of the head was performed using the standard protocol during bolus administration of intravenous contrast. Multiplanar CT image reconstructions and MIPs were obtained to evaluate the vascular anatomy. CONTRAST:  ISOVUE-370 IOPAMIDOL (ISOVUE-370) INJECTION 76% COMPARISON:  Head CT 04/25/2018.  No prior angiographic imaging. FINDINGS: Anterior circulation: The internal carotid arteries are widely patent from the included cervical portions through the termini. The distal left cervical ICA is tortuous. There is a wide-neck aneurysm projecting posteriorly from the supraclinoid left ICA in the posterior communicating region measuring 16 x 9 x 11 mm. ACAs and MCAs are patent without evidence of proximal branch occlusion or flow limiting proximal stenosis. Mild diffuse branch vessel irregularity is considered artifactual. Posterior circulation: The visualized distal vertebral arteries are widely patent to the basilar and codominant. Right PICA and bilateral SCA is are grossly patent. The basilar artery and PCAs are patent with mild diffuse irregularity which may be artifactual. Venous sinuses: Limited assessment due to arterial dominant contrast  timing. Grossly patent superior sagittal and straight sinuses. Anatomic variants: None. Delayed phase: Not performed. IMPRESSION: 1.6 cm left posterior communicating region aneurysm. Electronically Signed   By: Sebastian Ache M.D.   On: 04/25/2018 09:54   Ct Head Wo Contrast  Result Date: 04/25/2018 CLINICAL DATA:  42 year old male with severe headache, syncopal episode after sexual intercourse this morning. EXAM: CT HEAD WITHOUT CONTRAST  TECHNIQUE: Contiguous axial images were obtained from the base of the skull through the vertex without intravenous contrast. COMPARISON:  None. FINDINGS: Brain: Moderate volume hyperdense subarachnoid hemorrhage primarily involving the basilar cisterns. Slightly more blood in the left ambient cistern, and prominent posterior fossa SAH. Associated small volume of intraventricular hemorrhage most pronounced in the 3rd and 4th ventricles. Mild generalized ventriculomegaly. No acute cortically based infarct. No midline shift or other extra-axial blood or fluid collection. Vascular: No discrete abnormality. Skull: Negative. Sinuses/Orbits: Ethmoid mucosal thickening, other visible paranasal sinuses tympanic cavities and mastoids are well pneumatized. Other: Visualized orbits and scalp soft tissues are within normal limits. IMPRESSION: 1. Intracranial Aneurysm Rupture suspected with basilar cistern predominant Subarachnoid Hemorrhage and small volume intraventricular blood. 2. Mild ventriculomegaly. 3. Critical Value/emergent results were called by telephone at the time of interpretation on 04/25/2018 at 0813 hours to PA [facility] in the ED, who verbally acknowledged these results. Electronically Signed   By: Odessa Fleming M.D.   On: 04/25/2018 08:21    BP (!) 130/94   Pulse 91   Resp (!) 21   Ht 5\' 11"  (1.803 m)   Wt 104.3 kg   SpO2 90%   BMI 32.08 kg/m   General Appearance:    Alert, cooperative, mild distress, appears stated age  Head:    Normocephalic, without obvious abnormality, atraumatic  Eyes:    PERRLA, conjunctiva/corneas clear, EOM's intact, fundi    benign, both eyes          Nose:   Nares normal, septum midline, mucosa normal, no drainage    or sinus tenderness  Throat:   Lips, mucosa, and tongue normal; teeth and gums normal  Neck:   Supple, symmetrical, trachea midline, no adenopathy;       thyroid:  No enlargement/tenderness/nodules; no carotid   bruit or JVD     Lungs:     Clear to  auscultation bilaterally, respirations unlabored     Heart:    Regular rate and rhythm, S1 and S2 normal, no murmur, rub   or gallop  Abdomen:     Soft, non-tender, bowel sounds active all four quadrants,    no masses, no organomegaly  Genitalia:    Normal male without lesion, discharge or tenderness     Extremities:   Extremities normal, atraumatic, no cyanosis or edema  Pulses:   2+ and symmetric all extremities  Skin:   Skin color, texture, turgor normal  Lymph nodes:   Cervical, supraclavicular, and axillary nodes normal  Neurologic:   CNII-XII intact. Normal strength, sensation and reflexes      throughout    ECG: normal sinus rhythm, no blocks or conduction defects, no ischemic changes.  Data Review CBC:  Lab Results  Component Value Date   WBC 4.3 04/25/2018   RBC 5.97 (H) 04/25/2018   BMP:  Lab Results  Component Value Date   GLUCOSE 107 (H) 04/25/2018   CO2 22 04/25/2018   BUN 7 04/25/2018   BUN 10 04/14/2017   CREATININE 0.80 04/25/2018   CREATININE 0.85 11/06/2010   CALCIUM 8.8 (L)  04/25/2018    Assessment:   Active Problems:   Subarachnoid hemorrhage (HCC) Hunt-Hess Score: 1 There is a wide-neck aneurysm projecting posteriorly from the supraclinoid left ICA in the posterior communicating region measuring 16 x 9 x 11 mm.   Plan:   Admit to 4N ICU.  Interventional radiology consulted.  Arteriogram with Dr. Corliss Skainseveshwar for intravascular coiling versus craniotomy with aneurysm clipping by Dr. Jordan LikesPool.   Patient seen and examined. I agree with assessment and plan

## 2018-04-25 NOTE — Transfer of Care (Signed)
Immediate Anesthesia Transfer of Care Note  Patient: Glade NurseLawrence Fernholz  Procedure(s) Performed: IR WITH ANESTHESIA (N/A )  Patient Location: PACU  Anesthesia Type:General  Level of Consciousness: awake, alert , oriented and patient cooperative  Airway & Oxygen Therapy: Patient Spontanous Breathing and Patient connected to face mask oxygen  Post-op Assessment: Report given to RN and Post -op Vital signs reviewed and stable  Post vital signs: Reviewed and stable  Last Vitals:  Vitals Value Taken Time  BP 124/87 04/25/2018  3:20 PM  Temp 36.5 C 04/25/2018  3:20 PM  Pulse 75 04/25/2018  3:31 PM  Resp 17 04/25/2018  3:31 PM  SpO2 91 % 04/25/2018  3:31 PM  Vitals shown include unvalidated device data.  Last Pain:  Vitals:   04/25/18 1520  TempSrc:   PainSc: (P) 0-No pain         Complications: No apparent anesthesia complications

## 2018-04-25 NOTE — Consult Note (Signed)
Chief Complaint: Patient was seen in consultation today for subarachnoid hemorrhage.  Referring Physician(s): Pool, Anne Ng  Supervising Physician: Julieanne Cotton  Patient Status: [facility] - In-pt  History of Present Illness: Clarence Larsen is a 42 y.o. male with a past medical history of high cholesterol, atrial fibrillation, and current tobacco abuse. He presented to [facility] ED this AM with complaint of sudden onset of headache. States he was having intercourse with his wife this AM when headache developed. Wife reports he "lost consciousness" during intercourse. Describes headache as a constant throbbing pain rated 9/10. States he also vomited x1 while driving to [facility] ED. Was found to have a subarachnoid hemorrhage (suspected cause left PCOM aneurysm) and was admitted for management.  CT head 04/25/2018: 1. Intracranial Aneurysm Rupture suspected with basilar cistern predominant Subarachnoid Hemorrhage and small volume intraventricular blood. 2. Mild ventriculomegaly.  CTA head 04/25/2018: 1. 6 cm left posterior communicating region aneurysm.  IR requested by Dr. Jordan Likes for management of subarachnoid hemorrhage. Patient awake and alert laying in bed. Accompanied by wife and son at bedside. Complains of headache, stable since admission. Denies additional episodes of vomiting since admission. Denies fever, chills, chest pain, dyspnea, abdominal pain, or vision changes.   History reviewed. No pertinent past medical history.  History reviewed. No pertinent surgical history.  Allergies: Patient has no known allergies.  Medications: Prior to Admission medications   Medication Sig Start Date End Date Taking? Authorizing Provider  aspirin 81 MG chewable tablet Chew 1 tablet (81 mg total) by mouth daily. Patient not taking: Reported on 04/25/2018 04/23/16   McKeag, Janine Ores, MD  metoprolol succinate (TOPROL-XL) 25 MG 24 hr tablet Take 1 tablet (25 mg total) by mouth daily. Patient not  taking: Reported on 04/25/2018 04/14/17   Berton Bon, MD  traMADol (ULTRAM) 50 MG tablet Take 1 tablet (50 mg total) by mouth every 6 (six) hours as needed. Patient not taking: Reported on 04/25/2018 02/13/15   Latrelle Dodrill, MD     Family History  Problem Relation Age of Onset  . Cancer Father        Spleen cancer on chemo    Social History   Socioeconomic History  . Marital status: Married    Spouse name: Not on file  . Number of children: Not on file  . Years of education: Not on file  . Highest education level: Not on file  Occupational History  . Not on file  Social Needs  . Financial resource strain: Not on file  . Food insecurity:    Worry: Not on file    Inability: Not on file  . Transportation needs:    Medical: Not on file    Non-medical: Not on file  Tobacco Use  . Smoking status: Current Every Day Smoker    Packs/day: 0.00    Types: Cigarettes  . Smokeless tobacco: Never Used  Substance and Sexual Activity  . Alcohol use: Yes  . Drug use: No  . Sexual activity: Not on file  Lifestyle  . Physical activity:    Days per week: Not on file    Minutes per session: Not on file  . Stress: Not on file  Relationships  . Social connections:    Talks on phone: Not on file    Gets together: Not on file    Attends religious service: Not on file    Active member of club or organization: Not on file    Attends meetings of clubs  or organizations: Not on file    Relationship status: Not on file  Other Topics Concern  . Not on file  Social History Narrative  . Not on file     Review of Systems: A 12 point ROS discussed and pertinent positives are indicated in the HPI above.  All other systems are negative.  Review of Systems  Constitutional: Negative for chills and fever.  Eyes: Negative for visual disturbance.  Respiratory: Negative for shortness of breath and wheezing.   Cardiovascular: Negative for chest pain and palpitations.    Gastrointestinal: Positive for nausea and vomiting. Negative for abdominal pain.  Neurological: Positive for headaches. Negative for dizziness.  Psychiatric/Behavioral: Negative for behavioral problems and confusion.    Vital Signs: BP (!) 130/94   Pulse 91   Temp 98.1 F (36.7 C) (Oral)   Resp (!) 21   Ht 5\' 11"  (1.803 m)   Wt 230 lb (104.3 kg)   SpO2 90%   BMI 32.08 kg/m   Physical Exam  Constitutional: He is oriented to person, place, and time. He appears well-developed and well-nourished. No distress.  Cardiovascular: Normal rate, regular rhythm and normal heart sounds.  No murmur heard. Pulmonary/Chest: Effort normal and breath sounds normal. No respiratory distress. He has no wheezes.  Neurological: He is alert and oriented to person, place, and time.  Skin: Skin is warm and dry.  Psychiatric: He has a normal mood and affect. His behavior is normal. Judgment and thought content normal.  Nursing note and vitals reviewed.    MD Evaluation Airway: WNL Heart: WNL Abdomen: WNL Chest/ Lungs: WNL ASA  Classification: 3 Mallampati/Airway Score: One   Imaging: Ct Angio Head W Or Wo Contrast  Result Date: 04/25/2018 CLINICAL DATA:  Subarachnoid hemorrhage. EXAM: CT ANGIOGRAPHY HEAD TECHNIQUE: Multidetector CT imaging of the head was performed using the standard protocol during bolus administration of intravenous contrast. Multiplanar CT image reconstructions and MIPs were obtained to evaluate the vascular anatomy. CONTRAST:  ISOVUE-370 IOPAMIDOL (ISOVUE-370) INJECTION 76% COMPARISON:  Head CT 04/25/2018.  No prior angiographic imaging. FINDINGS: Anterior circulation: The internal carotid arteries are widely patent from the included cervical portions through the termini. The distal left cervical ICA is tortuous. There is a wide-neck aneurysm projecting posteriorly from the supraclinoid left ICA in the posterior communicating region measuring 16 x 9 x 11 mm. ACAs and MCAs  are patent without evidence of proximal branch occlusion or flow limiting proximal stenosis. Mild diffuse branch vessel irregularity is considered artifactual. Posterior circulation: The visualized distal vertebral arteries are widely patent to the basilar and codominant. Right PICA and bilateral SCA is are grossly patent. The basilar artery and PCAs are patent with mild diffuse irregularity which may be artifactual. Venous sinuses: Limited assessment due to arterial dominant contrast timing. Grossly patent superior sagittal and straight sinuses. Anatomic variants: None. Delayed phase: Not performed. IMPRESSION: 1.6 cm left posterior communicating region aneurysm. Electronically Signed   By: Sebastian Ache M.D.   On: 04/25/2018 09:54   Ct Head Wo Contrast  Result Date: 04/25/2018 CLINICAL DATA:  42 year old male with severe headache, syncopal episode after sexual intercourse this morning. EXAM: CT HEAD WITHOUT CONTRAST TECHNIQUE: Contiguous axial images were obtained from the base of the skull through the vertex without intravenous contrast. COMPARISON:  None. FINDINGS: Brain: Moderate volume hyperdense subarachnoid hemorrhage primarily involving the basilar cisterns. Slightly more blood in the left ambient cistern, and prominent posterior fossa SAH. Associated small volume of intraventricular hemorrhage most pronounced in  the 3rd and 4th ventricles. Mild generalized ventriculomegaly. No acute cortically based infarct. No midline shift or other extra-axial blood or fluid collection. Vascular: No discrete abnormality. Skull: Negative. Sinuses/Orbits: Ethmoid mucosal thickening, other visible paranasal sinuses tympanic cavities and mastoids are well pneumatized. Other: Visualized orbits and scalp soft tissues are within normal limits. IMPRESSION: 1. Intracranial Aneurysm Rupture suspected with basilar cistern predominant Subarachnoid Hemorrhage and small volume intraventricular blood. 2. Mild ventriculomegaly. 3.  Critical Value/emergent results were called by telephone at the time of interpretation on 04/25/2018 at 0813 hours to PA [facility] in the ED, who verbally acknowledged these results. Electronically Signed   By: Odessa Fleming M.D.   On: 04/25/2018 08:21    Labs:  CBC: Recent Labs    04/25/18 0723 04/25/18 0730  WBC 4.3  --   HGB 16.1 17.7*  HCT 50.8 52.0  PLT 176  --     COAGS: No results for input(s): INR, APTT in the last 8760 hours.  BMP: Recent Labs    04/25/18 0723 04/25/18 0730  NA 138 139  K 3.4* 3.4*  CL 100 105  CO2 22  --   GLUCOSE 109* 107*  BUN 7 7  CALCIUM 8.8*  --   CREATININE 0.80 0.80  GFRNONAA >60  --   GFRAA >60  --     LIVER FUNCTION TESTS: No results for input(s): BILITOT, AST, ALT, ALKPHOS, PROT, ALBUMIN in the last 8760 hours.  TUMOR MARKERS: No results for input(s): AFPTM, CEA, CA199, CHROMGRNA in the last 8760 hours.  Assessment and Plan:  Subarachnoid hemorrhage. Plan for image-guided diagnostic cerebral angiogram with possible endovascular intervention. Patient is NPO. Afebrile and WBCs WNL. He does not take blood thinners. INR pending.  Risks and benefits of cerebral angiogram with intervention were discussed with the patient including, but not limited to bleeding, infection, vascular injury, contrast induced renal failure, stroke or even death. This interventional procedure involves the use of X-rays and because of the nature of the planned procedure, it is possible that we will have prolonged use of X-ray fluoroscopy. Potential radiation risks to you include (but are not limited to) the following: - A slightly elevated risk for cancer  several years later in life. This risk is typically less than 0.5% percent. This risk is low in comparison to the normal incidence of human cancer, which is 33% for women and 50% for men according to the American Cancer Society. - Radiation induced injury can include skin redness, resembling a rash, tissue  breakdown / ulcers and hair loss (which can be temporary or permanent).  The likelihood of either of these occurring depends on the difficulty of the procedure and whether you are sensitive to radiation due to previous procedures, disease, or genetic conditions.  IF your procedure requires a prolonged use of radiation, you will be notified and given written instructions for further action.  It is your responsibility to monitor the irradiated area for the 2 weeks following the procedure and to notify your physician if you are concerned that you have suffered a radiation induced injury.   All of the patient's questions were answered, patient is agreeable to proceed. Consent signed and in chart.   Thank you for this interesting consult.  I greatly enjoyed meeting Trevonn Hallum and look forward to participating in their care.  A copy of this report was sent to the requesting provider on this date.  Electronically Signed: Elwin Mocha, PA-C 04/25/2018, 10:41 AM   I spent  a total of 20 Minutes in face to face in clinical consultation, greater than 50% of which was counseling/coordinating care for subarachnoid hemorrhage.

## 2018-04-25 NOTE — Progress Notes (Signed)
Saw patient in PACU following procedure. Patient underwent an image-guided diagnostic cerebral angiogram with obliteration of left ICA posterior wall intracranial aneurysm using primary coiling today by Dr. Corliss Skainseveshwar.  Patient laying in bed lethargic but responds to voice and follows simple commands. Denies complaints at this time.  Lethargic but respond to voice and follows simple commands. Speech and comprehension intact. PERRL 2 mm bilaterally. EOMs intact bilaterally without nystagmus or subjective diplopia. Visual fields not assessed. No facial asymmetry. Tongue midline. Motor power symmetric proportional to effort. Pronator drift not assessed. Fine motor and coordination not assessed. Gait not assessed. Romberg not assessed. Heel to toe not assessed. Distal pulses 2+ bilaterally. Right groin incision soft without active bleeding or hematoma.  Plan to transfer to neuro ICU. Appreciate and agree with neurosurgery management. IR to follow.  Waylan Bogalexandra M Louk, PA-C 04/25/2018, 4:19 PM

## 2018-04-25 NOTE — Anesthesia Procedure Notes (Addendum)
Arterial Line Insertion Performed by: Lucretia KernWitman, Ayvin Lipinski E, MD, anesthesiologist  Patient location: Pre-op. Preanesthetic checklist: patient identified, IV checked, risks and benefits discussed, surgical consent, monitors and equipment checked, pre-op evaluation and anesthesia consent Lidocaine 1% used for infiltration Left, radial was placed Catheter size: 20 G Hand hygiene performed  and Seldinger technique used  Attempts: 1 Procedure performed using ultrasound guided technique. Ultrasound Notes:anatomy identified, needle tip was noted to be adjacent to the nerve/plexus identified and no ultrasound evidence of intravascular and/or intraneural injection Following insertion, dressing applied and Biopatch. Post procedure assessment: normal and unchanged  Patient tolerated the procedure well with no immediate complications.

## 2018-04-26 ENCOUNTER — Inpatient Hospital Stay (HOSPITAL_COMMUNITY): Payer: 59

## 2018-04-26 ENCOUNTER — Encounter (HOSPITAL_COMMUNITY): Payer: Self-pay | Admitting: Radiology

## 2018-04-26 DIAGNOSIS — I609 Nontraumatic subarachnoid hemorrhage, unspecified: Secondary | ICD-10-CM

## 2018-04-26 LAB — CBC WITH DIFFERENTIAL/PLATELET
Abs Immature Granulocytes: 0.04 10*3/uL (ref 0.00–0.07)
Basophils Absolute: 0 10*3/uL (ref 0.0–0.1)
Basophils Relative: 0 %
Eosinophils Absolute: 0.1 10*3/uL (ref 0.0–0.5)
Eosinophils Relative: 1 %
HCT: 50.2 % (ref 39.0–52.0)
Hemoglobin: 16.3 g/dL (ref 13.0–17.0)
Immature Granulocytes: 0 %
Lymphocytes Relative: 14 %
Lymphs Abs: 1.4 10*3/uL (ref 0.7–4.0)
MCH: 27.2 pg (ref 26.0–34.0)
MCHC: 32.5 g/dL (ref 30.0–36.0)
MCV: 83.8 fL (ref 80.0–100.0)
Monocytes Absolute: 0.9 10*3/uL (ref 0.1–1.0)
Monocytes Relative: 9 %
NRBC: 0 % (ref 0.0–0.2)
Neutro Abs: 7.6 10*3/uL (ref 1.7–7.7)
Neutrophils Relative %: 76 %
Platelets: 160 10*3/uL (ref 150–400)
RBC: 5.99 MIL/uL — ABNORMAL HIGH (ref 4.22–5.81)
RDW: 13.8 % (ref 11.5–15.5)
WBC: 10.1 10*3/uL (ref 4.0–10.5)

## 2018-04-26 LAB — BASIC METABOLIC PANEL
Anion gap: 11 (ref 5–15)
CO2: 26 mmol/L (ref 22–32)
CREATININE: 0.69 mg/dL (ref 0.61–1.24)
Calcium: 8.9 mg/dL (ref 8.9–10.3)
Chloride: 99 mmol/L (ref 98–111)
GFR calc Af Amer: >60 mL/min (ref >60)
GFR calc non Af Amer: >60 mL/min (ref >60)
Glucose, Bld: 125 mg/dL — ABNORMAL HIGH (ref 70–99)
Potassium: 3.3 mmol/L — ABNORMAL LOW (ref 3.5–5.1)
Sodium: 136 mmol/L (ref 135–145)

## 2018-04-26 MED ORDER — HYDROCODONE-ACETAMINOPHEN 5-325 MG PO TABS
1.0000 | ORAL_TABLET | ORAL | Status: DC | PRN
  Administered 2018-04-26 – 2018-05-04 (×33): 1 via ORAL
  Filled 2018-04-26 (×34): qty 1

## 2018-04-26 MED ORDER — NICOTINE 14 MG/24HR TD PT24
14.0000 mg | MEDICATED_PATCH | Freq: Every day | TRANSDERMAL | Status: DC
  Administered 2018-04-26 – 2018-05-05 (×10): 14 mg via TRANSDERMAL
  Filled 2018-04-26 (×10): qty 1

## 2018-04-26 MED ORDER — SPIRITUS FRUMENTI
1.0000 | Freq: Three times a day (TID) | ORAL | Status: DC
  Administered 2018-04-26 – 2018-04-27 (×4): 1 via ORAL
  Filled 2018-04-26 (×30): qty 1

## 2018-04-26 NOTE — Anesthesia Postprocedure Evaluation (Signed)
Anesthesia Post Note  Patient: Clarence Larsen  Procedure(s) Performed: IR WITH ANESTHESIA (N/A )     Patient location during evaluation: PACU Anesthesia Type: General Level of consciousness: awake and alert Pain management: pain level controlled Vital Signs Assessment: post-procedure vital signs reviewed and stable Respiratory status: spontaneous breathing, nonlabored ventilation, respiratory function stable and patient connected to nasal cannula oxygen Cardiovascular status: blood pressure returned to baseline and stable Postop Assessment: no apparent nausea or vomiting Anesthetic complications: no    Last Vitals:  Vitals:   04/26/18 1438 04/26/18 1500  BP: 123/78 112/60  Pulse: 90 85  Resp: 19 12  Temp:    SpO2: 95% 99%    Last Pain:  Vitals:   04/26/18 1200  TempSrc: Oral  PainSc:                  Lucretia Kernarolyn E Marlean Mortell

## 2018-04-26 NOTE — Progress Notes (Addendum)
Transcranial Doppler  Date POD PCO2 HCT BP  MCA ACA PCA OPHT SIPH VERT Basilar  04/26/18     Right  Left   41  56   19  *   *  18   14  20    *  *   *  *   *  *         Right  Left                                            Right  Left                                             Right  Left                                             Right  Left                                            Right  Left                                            Right  Left                                        MCA = Middle Cerebral Artery      OPHT = Opthalmic Artery     BASILAR = Basilar Artery   ACA = Anterior Cerebral Artery     SIPH = Carotid Siphon PCA = Posterior Cerebral Artery   VERT = Verterbral Artery                  (*- not visualized)  Normal MCA = 62+\-12 ACA = 50+\-12 PCA = 42+\-23   *Unable to insonate. Right Lindegaard ratio=2.56, left Lindegaard ratio= 2.8  Blanch MediaMegan Jenya Putz 04/26/2018 11:39 AM

## 2018-04-26 NOTE — Evaluation (Signed)
Occupational Therapy Evaluation Patient Details Name: Clarence Larsen MRN: [id number] DOB: 09/22/1975 Today's Date: 04/26/2018    History of Present Illness Patient is a 42 y.o. male who presents with sudden onset of a severe headache. CT reveals Intracranial Aneurysm Rupture suspected with basilar cistern predominant Subarachnoid Hemorrhage and small volume intraventricular blood. S/P near complete obliteration using primary coiling 04/25/2018 by Dr. Corliss Skainseveshwar.    Clinical Impression   PTA patient independent and working.  Patient admitted for above and limited by problem list below, including decreased safety awareness.  Patient demonstrates ability to complete UB ADLs with supervision, LB ADLs with min guard assist, toilet transfers with min guard assist and grooming standing with min guard assist.  Patient will benefit from continued OT services while admitted in order to optimize safety and independence with ADLs, but anticipate no further needs after dc.  Will continue to follow.     Follow Up Recommendations  No OT follow up    Equipment Recommendations  None recommended by OT    Recommendations for Other Services       Precautions / Restrictions Restrictions Weight Bearing Restrictions: No      Mobility Bed Mobility Overal bed mobility: Needs Assistance Bed Mobility: Supine to Sit     Supine to sit: Min guard     General bed mobility comments: for safety   Transfers Overall transfer level: Needs assistance Equipment used: None Transfers: Sit to/from Stand Sit to Stand: Min guard;Supervision         General transfer comment: min guard fading to supervision, for safety and line mgmt     Balance Overall balance assessment: Mild deficits observed, not formally tested                                         ADL either performed or assessed with clinical judgement   ADL Overall ADL's : Needs assistance/impaired     Grooming: Min  guard;Standing   Upper Body Bathing: Set up;Sitting   Lower Body Bathing: Min guard;Sit to/from stand   Upper Body Dressing : Set up;Sitting   Lower Body Dressing: Sit to/from stand;Minimal assistance   Toilet Transfer: Min guard;Ambulation Toilet Transfer Details (indicate cue type and reason): simulated to recliner          Functional mobility during ADLs: Min guard;Supervision/safety(min guard initally, fading to supervision) General ADL Comments: patient requires cueing for safety awarenss and line mgmt      Vision Baseline Vision/History: No visual deficits Patient Visual Report: No change from baseline Vision Assessment?: Yes Eye Alignment: Within Functional Limits Ocular Range of Motion: Within Functional Limits Alignment/Gaze Preference: Within Defined Limits Tracking/Visual Pursuits: Able to track stimulus in all quads without difficulty Convergence: Within functional limits     Perception     Praxis      Pertinent Vitals/Pain Pain Assessment: No/denies pain     Hand Dominance Right   Extremity/Trunk Assessment Upper Extremity Assessment Upper Extremity Assessment: Overall WFL for tasks assessed   Lower Extremity Assessment Lower Extremity Assessment: Defer to PT evaluation   Cervical / Trunk Assessment Cervical / Trunk Assessment: Normal   Communication Communication Communication: No difficulties   Cognition Arousal/Alertness: Awake/alert Behavior During Therapy: WFL for tasks assessed/performed Overall Cognitive Status: Within Functional Limits for tasks assessed  General Comments: mildly agitated, due drinking and smoking a lot typically   General Comments       Exercises     Shoulder Instructions      Home Living Family/patient expects to be discharged to:: Private residence Living Arrangements: Spouse/significant other(mother in law, 22, 18,14) Available Help at Discharge:  Family;Available PRN/intermittently Type of Home: House Home Access: Stairs to enter [employer] of Steps: 1 Entrance Stairs-Rails: None Home Layout: Two level Alternate Level Stairs-Number of Steps: 21 Alternate Level Stairs-Rails: Left Bathroom Shower/Tub: Producer, television/film/video: Standard     Home Equipment: None          Prior Functioning/Environment Level of Independence: Independent        Comments: works in Garment/textile technologist Problem List: Decreased activity tolerance;Impaired balance (sitting and/or standing);Pain;Decreased safety awareness;Decreased knowledge of precautions      OT Treatment/Interventions: Self-care/ADL training;Balance training;Patient/family education;Therapeutic exercise;DME and/or AE instruction    OT Goals(Current goals can be found in the care plan section) Acute Rehab OT Goals Patient Stated Goal: to get home  OT Goal Formulation: With patient Time For Goal Achievement: 05/10/18 Potential to Achieve Goals: Good  OT Frequency: Min 2X/week   Barriers to D/C:            Co-evaluation PT/OT/SLP Co-Evaluation/Treatment: Yes Reason for Co-Treatment: Complexity of the patient's impairments (multi-system involvement)   OT goals addressed during session: ADL's and self-care;Other (comment)(mobility)      AM-PAC OT "6 Clicks" Daily Activity     Outcome Measure Help from another person eating meals?: None Help from another person taking care of personal grooming?: A Little Help from another person toileting, which includes using toliet, bedpan, or urinal?: A Little Help from another person bathing (including washing, rinsing, drying)?: A Little Help from another person to put on and taking off regular upper body clothing?: None Help from another person to put on and taking off regular lower body clothing?: A Little 6 Click Score: 20   End of Session Nurse Communication: Mobility status  Activity Tolerance:  Patient tolerated treatment well Patient left: in chair;with call bell/phone within reach;with chair alarm set;with family/visitor present  OT Visit Diagnosis: Other abnormalities of gait and mobility (R26.89);Unsteadiness on feet (R26.81);Pain Pain - part of body: (headache)                Time: 1610-9604 OT Time Calculation (min): 22 min Charges:  OT General Charges $OT Visit: 1 Visit OT Evaluation $OT Eval Low Complexity: 1 Low  Chancy Milroy, OT Acute Rehabilitation Services Pager [phone] Office [phone]   Chancy Milroy 04/26/2018, 1:55 PM

## 2018-04-26 NOTE — Progress Notes (Signed)
Referring Physician(s): Pool, Sherilyn Cooter  Supervising Physician: Julieanne Cotton  Patient Status:  Clarence Larsen - In-pt  Chief Complaint: "Headache"  Subjective:  Left ICA posterior wall intracranial aneurysm s/p near complete obliteration using primary coiling 04/25/2018 by Dr. Corliss Skains. Patient awake and alert laying in bed. Accompanied by wife at bedside. Complains of mild headache. States it is less severe than yesterday. Right groin incision c/d/i.   Allergies: Patient has no known allergies.  Medications: Prior to Admission medications   Medication Sig Start Date End Date Taking? Authorizing Provider  aspirin 81 MG chewable tablet Chew 1 tablet (81 mg total) by mouth daily. Patient not taking: Reported on 04/25/2018 04/23/16   McKeag, Janine Ores, MD  metoprolol succinate (TOPROL-XL) 25 MG 24 hr tablet Take 1 tablet (25 mg total) by mouth daily. Patient not taking: Reported on 04/25/2018 04/14/17   Berton Bon, MD  traMADol (ULTRAM) 50 MG tablet Take 1 tablet (50 mg total) by mouth every 6 (six) hours as needed. Patient not taking: Reported on 04/25/2018 02/13/15   Latrelle Dodrill, MD     Vital Signs: BP (!) 117/56   Pulse 96   Temp 98.4 F (36.9 C) (Oral)   Resp 13   Ht 5\' 11"  (1.803 m)   Wt 230 lb (104.3 kg)   SpO2 99%   BMI 32.08 kg/m   Physical Exam  Constitutional: He appears well-developed and well-nourished. No distress.  Pulmonary/Chest: Effort normal. No respiratory distress.  Neurological:  Alert, awake, and oriented x3. Speech and comprehension intact. PERRL bilaterally. EOMs intact bilaterally without nystagmus or subjective diplopia. Visual fields not assessed. No facial asymmetry. Tongue midline. Motor power symmetric proportional to effort. No pronator drift. Fine motor and coordination not assessed. Gait not assessed. Romberg not assessed. Heel to toe not assessed. Distal pulses 2+ bilaterally.  Skin: Skin is warm and dry.  Right  groin incision soft without active bleeding or hematoma.  Psychiatric: He has a normal mood and affect. His behavior is normal. Judgment and thought content normal.  Nursing note and vitals reviewed.   Imaging: Ct Angio Head W Or Wo Contrast  Result Date: 04/25/2018 CLINICAL DATA:  Subarachnoid hemorrhage. EXAM: CT ANGIOGRAPHY HEAD TECHNIQUE: Multidetector CT imaging of the head was performed using the standard protocol during bolus administration of intravenous contrast. Multiplanar CT image reconstructions and MIPs were obtained to evaluate the vascular anatomy. CONTRAST:  ISOVUE-370 IOPAMIDOL (ISOVUE-370) INJECTION 76% COMPARISON:  Head CT 04/25/2018.  No prior angiographic imaging. FINDINGS: Anterior circulation: The internal carotid arteries are widely patent from the included cervical portions through the termini. The distal left cervical ICA is tortuous. There is a wide-neck aneurysm projecting posteriorly from the supraclinoid left ICA in the posterior communicating region measuring 16 x 9 x 11 mm. ACAs and MCAs are patent without evidence of proximal branch occlusion or flow limiting proximal stenosis. Mild diffuse branch vessel irregularity is considered artifactual. Posterior circulation: The visualized distal vertebral arteries are widely patent to the basilar and codominant. Right PICA and bilateral SCA is are grossly patent. The basilar artery and PCAs are patent with mild diffuse irregularity which may be artifactual. Venous sinuses: Limited assessment due to arterial dominant contrast timing. Grossly patent superior sagittal and straight sinuses. Anatomic variants: None. Delayed phase: Not performed. IMPRESSION: 1.6 cm left posterior communicating region aneurysm. Electronically Signed   By: Sebastian Ache M.D.   On: 04/25/2018 09:54   Ct Head Wo Contrast  Result Date: 04/25/2018 CLINICAL DATA:  42 year old male with severe headache, syncopal episode after sexual intercourse this  morning. EXAM: CT HEAD WITHOUT CONTRAST TECHNIQUE: Contiguous axial images were obtained from the base of the skull through the vertex without intravenous contrast. COMPARISON:  None. FINDINGS: Brain: Moderate volume hyperdense subarachnoid hemorrhage primarily involving the basilar cisterns. Slightly more blood in the left ambient cistern, and prominent posterior fossa SAH. Associated small volume of intraventricular hemorrhage most pronounced in the 3rd and 4th ventricles. Mild generalized ventriculomegaly. No acute cortically based infarct. No midline shift or other extra-axial blood or fluid collection. Vascular: No discrete abnormality. Skull: Negative. Sinuses/Orbits: Ethmoid mucosal thickening, other visible paranasal sinuses tympanic cavities and mastoids are well pneumatized. Other: Visualized orbits and scalp soft tissues are within normal limits. IMPRESSION: 1. Intracranial Aneurysm Rupture suspected with basilar cistern predominant Subarachnoid Hemorrhage and small volume intraventricular blood. 2. Mild ventriculomegaly. 3. Critical Value/emergent results were called by telephone at the time of interpretation on 04/25/2018 at 0813 hours to [facility], who verbally acknowledged these results. Electronically Signed   By: Odessa FlemingH  Hall M.D.   On: 04/25/2018 08:21    Labs:  CBC: Recent Labs    04/25/18 0723 04/25/18 0730 04/26/18 0709  WBC 4.3  --  PENDING  HGB 16.1 17.7* 16.3  HCT 50.8 52.0 50.2  PLT 176  --  160    COAGS: Recent Labs    04/25/18 0723  INR 1.08  APTT 28    BMP: Recent Labs    04/25/18 0723 04/25/18 0730 04/26/18 0709  NA 138 139 136  K 3.4* 3.4* 3.3*  CL 100 105 99  CO2 22  --  26  GLUCOSE 109* 107* 125*  BUN 7 7 <5*  CALCIUM 8.8*  --  8.9  CREATININE 0.80 0.80 0.69  GFRNONAA >60  --  >60  GFRAA >60  --  >60    LIVER FUNCTION TESTS: No results for input(s): BILITOT, AST, ALT, ALKPHOS, PROT, ALBUMIN in the last 8760 hours.  Assessment and  Plan:  Left ICA posterior wall intracranial aneurysm s/p near complete obliteration using primary coiling 04/25/2018 by Dr. Corliss Skainseveshwar. Patient's condition stable- still with mild headache. Right groin incision stable. Appreciate and agree with neurology management. IR to follow.   Electronically Signed: Elwin MochaAlexandra Magdeline Prange, PA-C 04/26/2018, 9:14 AM   I spent a total of 15 Minutes at the the patient's bedside AND on the patient's hospital floor or unit, greater than 50% of which was counseling/coordinating care for left ICA posterior wall intracranial aneurysm s/p obliteration.

## 2018-04-26 NOTE — Evaluation (Signed)
Physical Therapy Evaluation Patient Details Name: Clarence Larsen MRN: [id number] DOB: 02/05/1976 Today's Date: 04/26/2018   History of Present Illness  Patient is a 42 y.o. male who presents with sudden onset of a severe headache. CT reveals Intracranial Aneurysm Rupture suspected with basilar cistern predominant Subarachnoid Hemorrhage and small volume intraventricular blood. S/P near complete obliteration using primary coiling 04/25/2018 by Dr. Corliss Skainseveshwar.   Clinical Impression  Pt admitted with above. Pt functioning near baseline. Reports of mild posterior headache. Educated on stroke symptoms and to come to ED again if he experiences FAST again. PT to return to tomorrow for higher level balance assessment and stair negotiation. Acute PT to cont to follow.    Follow Up Recommendations No PT follow up;Supervision - Intermittent    Equipment Recommendations  None recommended by PT    Recommendations for Other Services       Precautions / Restrictions Precautions Precautions: Fall Restrictions Weight Bearing Restrictions: No      Mobility  Bed Mobility Overal bed mobility: Needs Assistance Bed Mobility: Supine to Sit     Supine to sit: Min guard     General bed mobility comments: pt slightly impulsive, min guard for safety  Transfers Overall transfer level: Needs assistance Equipment used: None Transfers: Sit to/from Stand Sit to Stand: Min guard;Supervision         General transfer comment: min guard fading to supervision, for safety and line mgmt   Ambulation/Gait Ambulation/Gait assistance: Min guard Gait Distance (Feet): 200 Feet Assistive device: None Gait Pattern/deviations: Step-through pattern;WFL(Within Functional Limits) Gait velocity: wfl Gait velocity interpretation: >2.62 ft/sec, indicative of community ambulatory General Gait Details: no epsiodes of LOB, steady and safe  Stairs            Wheelchair Mobility    Modified Rankin (Stroke  Patients Only) Modified Rankin (Stroke Patients Only) Pre-Morbid Rankin Score: No symptoms Modified Rankin: No significant disability     Balance Overall balance assessment: Mild deficits observed, not formally tested                                           Pertinent Vitals/Pain Pain Assessment: No/denies pain    Home Living Family/patient expects to be discharged to:: Private residence Living Arrangements: Spouse/significant other(mother in law, 22, 18,14) Available Help at Discharge: Family;Available PRN/intermittently Type of Home: House Home Access: Stairs to enter Entrance Stairs-Rails: None Entrance Stairs-Number of Steps: 1 Home Layout: Two level Home Equipment: None      Prior Function Level of Independence: Independent         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[employer], counsellingconstruction     Hand Dominance   Dominant Hand: Right    Extremity/Trunk Assessment   Upper Extremity Assessment Upper Extremity Assessment: Defer to OT evaluation    Lower Extremity Assessment Lower Extremity Assessment: Overall WFL for tasks assessed    Cervical / Trunk Assessment Cervical / Trunk Assessment: Normal  Communication   Communication: No difficulties  Cognition Arousal/Alertness: Awake/alert Behavior During Therapy: Impulsive Overall Cognitive Status: Within Functional Limits for tasks assessed                                 General Comments: quick to do things due to wanting ot go home       General Comments General comments (skin integrity, edema, etc.):  VSS    Exercises     Assessment/Plan    PT Assessment Patient needs continued PT services  PT Problem List Decreased activity tolerance;Decreased balance;Decreased mobility       PT Treatment Interventions DME instruction;Gait training;Stair training;Functional mobility training;Therapeutic activities;Therapeutic exercise;Balance training;Neuromuscular re-education    PT Goals (Current  goals can be found in the Care Plan section)  Acute Rehab PT Goals Patient Stated Goal: to get home  PT Goal Formulation: With patient Time For Goal Achievement: 05/10/18 Potential to Achieve Goals: Good Additional Goals Additional Goal #1: Pt to score >19 on DGI to indicate minimal falls risk.    Frequency Min 4X/week   Barriers to discharge        Co-evaluation PT/OT/SLP Co-Evaluation/Treatment: Yes Reason for Co-Treatment: Complexity of the patient's impairments (multi-system involvement) PT goals addressed during session: Mobility/safety with mobility OT goals addressed during session: ADL's and self-care;Other (comment)(mobility)       AM-PAC PT "6 Clicks" Mobility  Outcome Measure Help needed turning from your back to your side while in a flat bed without using bedrails?: None Help needed moving from lying on your back to sitting on the side of a flat bed without using bedrails?: None Help needed moving to and from a bed to a chair (including a wheelchair)?: None Help needed standing up from a chair using your arms (e.g., wheelchair or bedside chair)?: None Help needed to walk in hospital room?: A Little Help needed climbing 3-5 steps with a railing? : A Little 6 Click Score: 22    End of Session Equipment Utilized During Treatment: Gait belt Activity Tolerance: Patient tolerated treatment well Patient left: in chair;with call bell/phone within reach Nurse Communication: Mobility status PT Visit Diagnosis: Unsteadiness on feet (R26.81)    Time: 0981-1914 PT Time Calculation (min) (ACUTE ONLY): 22 min   Charges:   PT Evaluation $PT Eval Low Complexity: 1 Low          Clarence Larsen, PT, DPT Acute Rehabilitation Services Pager #: [phone] Office #: [phone]   Clarence Larsen 04/26/2018, 2:18 PM

## 2018-04-26 NOTE — Progress Notes (Signed)
Post coiling day 1  Overall patient doing well.  Denies headache.  No diplopia.  No other deficits.  Patient is afebrile.  His vital signs are stable.  Blood pressure well controlled.  Fluid balance slightly negative.  Patient is awake and alert.  He is oriented and appropriate.  Pupils are 4 mm and briskly reactive bilaterally.  Gaze is conjugate.  Extraocular movements intact bilaterally.  Mild left ptosis.  Facial movement and sensation normal bilaterally.  Tongue protrudes to the midline.  Motor 5/5 bilaterally.  No pronator drift.  Dopplers without evidence of obvious spasm.  Overall doing well.  Begin to mobilize slowly.  Continue IV fluids and observation.  Follow-up head CT scan in morning.

## 2018-04-26 NOTE — Evaluation (Signed)
Speech Language Pathology Evaluation Patient Details Name: Clarence Larsen MRN: [id number] DOB: 06/19/1975 Today's Date: 04/26/2018 Time: [id number] SLP Time Calculation (min) (ACUTE ONLY): 25 min  Problem List:  Patient Active Problem List   Diagnosis Date Noted  . Subarachnoid hemorrhage (HCC) 04/25/2018  . SAH (subarachnoid hemorrhage) (HCC) 04/25/2018  . Alcohol abuse 05/08/2016  . Atrial fibrillation (HCC) 04/23/2016  . Low back pain 01/12/2012  . TOBACCO USER 06/17/2009  . ELEVATED BP READING WITHOUT DX HYPERTENSION 06/17/2009   Past Medical History: History reviewed. No pertinent past medical history. Past Surgical History:  Past Surgical History:  Procedure Laterality Date  . RADIOLOGY WITH ANESTHESIA N/A 04/25/2018   Procedure: IR WITH ANESTHESIA;  Surgeon: Radiologist, Medication, MD;  Location: [facility] OR;  Service: Radiology;  Laterality: N/A;   HPI:  Patient is a 42 y.o. male who presents with sudden onset of a severe headache. CT reveals Intracranial Aneurysm Rupture suspected with basilar cistern predominant Subarachnoid Hemorrhage and small volume intraventricular blood. S/P near complete obliteration using primary coiling 04/25/2018 by Dr. Corliss Skainseveshwar.   Assessment / Plan / Recommendation Clinical Impression   Pt presents with grossly intact cognitive-linguistic function.  Cranial nerve exam was unremarkable for areas of focal weakness of sensory loss.  Speech was fluent and free from dysarthria or word finding deficits.  Pt's recall, problem solving, awareness, reasoning, and attention were all Erlanger BledsoeWFL for tasks assessed.  Do not feel that pt needs acute ST services at this time or additional ST follow up at next level of care; however, I did provide skilled education regarding compensatory memory and organizational techniques to facilitate ease of transition to home and work.  I also discussed OP ST services as a possible resource should pt experience difficulty resuming his previous  level of function.  All questions were answered to pt's satisfaction at this time.  Pt endorses that he feels he is back to his baseline for speech, language, and cognition.      SLP Assessment  SLP Recommendation/Assessment: Patient does not need any further Speech Lanaguage Pathology Services    Follow Up Recommendations  None          SLP Evaluation Cognition  Overall Cognitive Status: Within Functional Limits for tasks assessed       Comprehension  Auditory Comprehension Overall Auditory Comprehension: Appears within functional limits for tasks assessed    Expression Expression Primary Mode of Expression: Verbal Verbal Expression Overall Verbal Expression: Appears within functional limits for tasks assessed Written Expression Dominant Hand: Right   Oral / Motor  Oral Motor/Sensory Function Overall Oral Motor/Sensory Function: Within functional limits Motor Speech Overall Motor Speech: Appears within functional limits for tasks assessed   GO                    Elexia Friedt, Melanee SpryNicole L 04/26/2018, 3:30 PM

## 2018-04-26 NOTE — Care Management Note (Signed)
Case Management Note  Patient Details  Name: Clarence Larsen MRN: [id number] Date of Birth: 10/01/1975  Subjective/Objective:  Patient is a 42 y.o. male who presents with sudden onset of a severe headache. CT reveals intracranial aneurysm rupture suspected with basilar cistern predominant SAH and small volume intraventricular blood.  PTA, pt independent, lives with spouse.                   Action/Plan: PT/OT recommending no OP follow up.  Pt will remain in-house on [facility] therapy.  Will follow for discharge needs.   Expected Discharge Date:                  Expected Discharge Plan:  Home/Self Care  In-House Referral:     Discharge planning Services  CM Consult  Post Acute Care Choice:    Choice offered to:     DME Arranged:    DME Agency:     HH Arranged:    HH Agency:     Status of Service:  In process, will continue to follow  If discussed at Long Length of Stay Meetings, dates discussed:    Additional Comments:  Quintella BatonJulie W. Meia Emley, RN, BSN  Trauma/Neuro ICU Case Manager [phone]

## 2018-04-27 ENCOUNTER — Inpatient Hospital Stay (HOSPITAL_COMMUNITY): Payer: 59

## 2018-04-27 DIAGNOSIS — I609 Nontraumatic subarachnoid hemorrhage, unspecified: Secondary | ICD-10-CM

## 2018-04-27 LAB — HIV ANTIBODY (ROUTINE TESTING W REFLEX): HIV SCREEN 4TH GENERATION: NONREACTIVE

## 2018-04-27 LAB — TRIGLYCERIDES: Triglycerides: 87 mg/dL (ref <150)

## 2018-04-27 MED ORDER — INFLUENZA VAC SPLIT QUAD 0.5 ML IM SUSY
0.5000 mL | PREFILLED_SYRINGE | Freq: Once | INTRAMUSCULAR | Status: AC
  Administered 2018-04-27: 0.5 mL via INTRAMUSCULAR
  Filled 2018-04-27: qty 0.5

## 2018-04-27 MED ORDER — PNEUMOCOCCAL VAC POLYVALENT 25 MCG/0.5ML IJ INJ
0.5000 mL | INJECTION | Freq: Once | INTRAMUSCULAR | Status: AC
  Administered 2018-04-27: 0.5 mL via INTRAMUSCULAR
  Filled 2018-04-27: qty 0.5

## 2018-04-27 MED ORDER — ADULT MULTIVITAMIN W/MINERALS CH
1.0000 | ORAL_TABLET | Freq: Every day | ORAL | Status: DC
  Administered 2018-04-27 – 2018-05-05 (×9): 1 via ORAL
  Filled 2018-04-27 (×9): qty 1

## 2018-04-27 MED ORDER — LORAZEPAM 2 MG/ML IJ SOLN: 1.0000 mg | Freq: Four times a day (QID) | INTRAMUSCULAR | Status: AC | PRN

## 2018-04-27 MED ORDER — LORAZEPAM 1 MG PO TABS: 1.0000 mg | ORAL_TABLET | Freq: Four times a day (QID) | ORAL | Status: AC | PRN

## 2018-04-27 MED ORDER — FOLIC ACID 1 MG PO TABS
1.0000 mg | ORAL_TABLET | Freq: Every day | ORAL | Status: DC
  Administered 2018-04-27 – 2018-05-05 (×8): 1 mg via ORAL
  Filled 2018-04-27 (×9): qty 1

## 2018-04-27 MED ORDER — THIAMINE HCL 100 MG/ML IJ SOLN: 100.0000 mg | Freq: Every day | INTRAMUSCULAR | Status: DC

## 2018-04-27 MED ORDER — LORAZEPAM 1 MG PO TABS
0.0000 mg | ORAL_TABLET | Freq: Four times a day (QID) | ORAL | Status: AC
  Administered 2018-04-27 (×2): 2 mg via ORAL
  Filled 2018-04-27 (×2): qty 2

## 2018-04-27 MED ORDER — LORAZEPAM 1 MG PO TABS
0.0000 mg | ORAL_TABLET | Freq: Two times a day (BID) | ORAL | Status: AC
  Filled 2018-04-27: qty 1

## 2018-04-27 MED ORDER — BUTALBITAL-APAP-CAFFEINE 50-325-40 MG PO TABS
1.0000 | ORAL_TABLET | ORAL | Status: DC | PRN
  Administered 2018-04-27 (×2): 1 via ORAL
  Administered 2018-04-28: 2 via ORAL
  Administered 2018-04-28: 1 via ORAL
  Administered 2018-04-28: 2 via ORAL
  Administered 2018-04-29 – 2018-05-03 (×26): 1 via ORAL
  Administered 2018-05-04: 2 via ORAL
  Administered 2018-05-04: 1 via ORAL
  Administered 2018-05-04: 2 via ORAL
  Administered 2018-05-04: 1 via ORAL
  Filled 2018-04-27: qty 1
  Filled 2018-04-27: qty 2
  Filled 2018-04-27: qty 1
  Filled 2018-04-27 (×2): qty 2
  Filled 2018-04-27 (×2): qty 1
  Filled 2018-04-27: qty 2
  Filled 2018-04-27 (×9): qty 1
  Filled 2018-04-27: qty 2
  Filled 2018-04-27 (×13): qty 1
  Filled 2018-04-27: qty 2
  Filled 2018-04-27 (×6): qty 1

## 2018-04-27 MED ORDER — VITAMIN B-1 100 MG PO TABS
100.0000 mg | ORAL_TABLET | Freq: Every day | ORAL | Status: DC
  Administered 2018-04-27 – 2018-05-05 (×9): 100 mg via ORAL
  Filled 2018-04-27 (×9): qty 1

## 2018-04-27 NOTE — Progress Notes (Signed)
Referring Physician(s): Pool, Sherilyn Cooter  Supervising Physician: Julieanne Cotton  Patient Status:  [facility] - In-pt  Chief Complaint: "Headache"  Subjective:  Left ICA posterior wall intracranial aneurysm s/p near complete obliteration using primary coiling 04/25/2018 by Dr. Corliss Skains. Patient awake and alert laying in bed. Accompanied by wife at bedside. RN reports agitation last night due to alcohol withdraw, since patient has calmed down and is resting in bed now. Complains of mild headache, stable since yesterday. Right groin incision c/d/i.   Allergies: Patient has no known allergies.  Medications: Prior to Admission medications   Medication Sig Start Date End Date Taking? Authorizing Provider  aspirin 81 MG chewable tablet Chew 1 tablet (81 mg total) by mouth daily. Patient not taking: Reported on 04/25/2018 04/23/16   McKeag, Janine Ores, MD  metoprolol succinate (TOPROL-XL) 25 MG 24 hr tablet Take 1 tablet (25 mg total) by mouth daily. Patient not taking: Reported on 04/25/2018 04/14/17   Berton Bon, MD  traMADol (ULTRAM) 50 MG tablet Take 1 tablet (50 mg total) by mouth every 6 (six) hours as needed. Patient not taking: Reported on 04/25/2018 02/13/15   Latrelle Dodrill, MD     Vital Signs: BP 125/78   Pulse (!) 58   Temp (!) 97.5 F (36.4 C) (Oral)   Resp (!) 9   Ht 5\' 11"  (1.803 m)   Wt 230 lb (104.3 kg)   SpO2 100%   BMI 32.08 kg/m   Physical Exam  Constitutional: He appears well-developed and well-nourished. No distress.  Pulmonary/Chest: Effort normal. No respiratory distress.  Neurological:  Alert, awake, and oriented x3. Speech and comprehension intact. PERRL bilaterally. EOMs intact bilaterally without nystagmus or subjective diplopia. Visual fields not assessed. No facial asymmetry. Tongue midline. Motor power symmetric proportional to effort. No pronator drift. Fine motor and coordination not assessed. Gait not assessed. Romberg not  assessed. Heel to toe not assessed. Distal pulses 2+ bilaterally.   Skin: Skin is warm and dry.  Right groin incision soft without active bleeding or hematoma.  Psychiatric: He has a normal mood and affect. His behavior is normal. Judgment and thought content normal.  Nursing note and vitals reviewed.   Imaging: Ct Angio Head W Or Wo Contrast  Result Date: 04/25/2018 CLINICAL DATA:  Subarachnoid hemorrhage. EXAM: CT ANGIOGRAPHY HEAD TECHNIQUE: Multidetector CT imaging of the head was performed using the standard protocol during bolus administration of intravenous contrast. Multiplanar CT image reconstructions and MIPs were obtained to evaluate the vascular anatomy. CONTRAST:  ISOVUE-370 IOPAMIDOL (ISOVUE-370) INJECTION 76% COMPARISON:  Head CT 04/25/2018.  No prior angiographic imaging. FINDINGS: Anterior circulation: The internal carotid arteries are widely patent from the included cervical portions through the termini. The distal left cervical ICA is tortuous. There is a wide-neck aneurysm projecting posteriorly from the supraclinoid left ICA in the posterior communicating region measuring 16 x 9 x 11 mm. ACAs and MCAs are patent without evidence of proximal branch occlusion or flow limiting proximal stenosis. Mild diffuse branch vessel irregularity is considered artifactual. Posterior circulation: The visualized distal vertebral arteries are widely patent to the basilar and codominant. Right PICA and bilateral SCA is are grossly patent. The basilar artery and PCAs are patent with mild diffuse irregularity which may be artifactual. Venous sinuses: Limited assessment due to arterial dominant contrast timing. Grossly patent superior sagittal and straight sinuses. Anatomic variants: None. Delayed phase: Not performed. IMPRESSION: 1.6 cm left posterior communicating region aneurysm. Electronically Signed   By: Freida Busman  Mosetta Putt M.D.   On: 04/25/2018 09:54   Ct Head Wo Contrast  Result Date:  04/27/2018 CLINICAL DATA:  Follow-up coil embolization of ruptured PCOM aneurysm and subarachnoid hemorrhage. EXAM: CT HEAD WITHOUT CONTRAST TECHNIQUE: Contiguous axial images were obtained from the base of the skull through the vertex without intravenous contrast. COMPARISON:  CT HEAD April 25, 2018 FINDINGS: BRAIN: No intraparenchymal hemorrhage, mass effect nor midline shift. Mild residual ventriculomegaly, decreased from prior examination. Small amount of residual blood products fourth ventricle. Moderate volume subarachnoid hemorrhage decreased from prior examination. No intraparenchymal hemorrhage or acute large vascular territory collections. Narrowed though improved basal cisterns. No acute large vascular territory infarcts. No abnormal extra-axial fluid collections. Basal cisterns are patent. VASCULAR: Streak artifact from large coil mass for treatment of LEFT PCOM origin aneurysm. SKULL/SOFT TISSUES: No skull fracture. No significant soft tissue swelling. ORBITS/SINUSES: The included ocular globes and orbital contents are normal.Mild lobulated paranasal sinus mucosal thickening. Mastoid air cells are well aerated. OTHER: None. IMPRESSION: 1. Resolving hydrocephalus. Small amount of residual intraventricular hemorrhage. 2. Moderate residual subarachnoid hemorrhage, decreased. Electronically Signed   By: Awilda Metro M.D.   On: 04/27/2018 05:19   Ct Head Wo Contrast  Result Date: 04/25/2018 CLINICAL DATA:  42 year old male with severe headache, syncopal episode after sexual intercourse this morning. EXAM: CT HEAD WITHOUT CONTRAST TECHNIQUE: Contiguous axial images were obtained from the base of the skull through the vertex without intravenous contrast. COMPARISON:  None. FINDINGS: Brain: Moderate volume hyperdense subarachnoid hemorrhage primarily involving the basilar cisterns. Slightly more blood in the left ambient cistern, and prominent posterior fossa SAH. Associated small volume of  intraventricular hemorrhage most pronounced in the 3rd and 4th ventricles. Mild generalized ventriculomegaly. No acute cortically based infarct. No midline shift or other extra-axial blood or fluid collection. Vascular: No discrete abnormality. Skull: Negative. Sinuses/Orbits: Ethmoid mucosal thickening, other visible paranasal sinuses tympanic cavities and mastoids are well pneumatized. Other: Visualized orbits and scalp soft tissues are within normal limits. IMPRESSION: 1. Intracranial Aneurysm Rupture suspected with basilar cistern predominant Subarachnoid Hemorrhage and small volume intraventricular blood. 2. Mild ventriculomegaly. 3. Critical Value/emergent results were called by telephone at the time of interpretation on 04/25/2018 at 0813 hours to PA [facility] in the ED, who verbally acknowledged these results. Electronically Signed   By: Odessa Fleming M.D.   On: 04/25/2018 08:21   Vas [place] Transcranial Doppler  Result Date: 04/27/2018  Transcranial Doppler Indications: Subarachnoid hemorrhage. Performing Technologist: Blanch Media RVS Supporting Technologist: Gertie Fey RDMS, RVT, RDCS  Examination Guidelines: A complete evaluation includes B-mode imaging, spectral Doppler, color Doppler, and power Doppler as needed of all accessible portions of each vessel. Bilateral testing is considered an integral part of a complete examination. Limited examinations for reoccurring indications may be performed as noted.  +----------+-------------+----------+-----------+-------+ RIGHT TCD Right VM (cm)Depth (cm)PulsatilityComment +----------+-------------+----------+-----------+-------+ MCA           41.00                 1.09            +----------+-------------+----------+-----------+-------+ ACA           19.00                 1.03            +----------+-------------+----------+-----------+-------+ Term ICA      36.00                 1.14             +----------+-------------+----------+-----------+-------+  Opthalmic     14.00                 1.07            +----------+-------------+----------+-----------+-------+ Distal ICA    16.00                 0.98            +----------+-------------+----------+-----------+-------+  +----------+------------+----------+-----------+-------+ LEFT TCD  Left VM (cm)Depth (cm)PulsatilityComment +----------+------------+----------+-----------+-------+ MCA          56.00                 1.16            +----------+------------+----------+-----------+-------+ Term ICA     40.00                 1.25            +----------+------------+----------+-----------+-------+ PCA          18.00                 1.03            +----------+------------+----------+-----------+-------+ Opthalmic    20.00                 1.37            +----------+------------+----------+-----------+-------+ Distal ICA   20.00                 1.05            +----------+------------+----------+-----------+-------+  +----------------------+----+ Right Lindegaard Ratio2.56 +----------------------+----+ +---------------------+---+ Left Lindegaard Ratio2.8 +---------------------+---+  Summary: This was a normal transcranial Doppler study, with normal flow direction and velocity of all identified vessels of the anterior and posterior circulations, with no evidence of stenosis, vasospasm or occlusion. There was no evidence of intracranial disease.  Linited evaluation as absent occipital windows hence vertebrals and basilar arteries not studied *See table(s) above for measurements and observations.  Diagnosing physician: Delia Heady MD Electronically signed by Delia Heady MD on 04/27/2018 at 8:44:21 AM.    Final     Labs:  CBC: Recent Labs    04/25/18 0723 04/25/18 0730 04/26/18 0709  WBC 4.3  --  10.1  HGB 16.1 17.7* 16.3  HCT 50.8 52.0 50.2  PLT 176  --  160    COAGS: Recent Labs     04/25/18 0723  INR 1.08  APTT 28    BMP: Recent Labs    04/25/18 0723 04/25/18 0730 04/26/18 0709  NA 138 139 136  K 3.4* 3.4* 3.3*  CL 100 105 99  CO2 22  --  26  GLUCOSE 109* 107* 125*  BUN 7 7 <5*  CALCIUM 8.8*  --  8.9  CREATININE 0.80 0.80 0.69  GFRNONAA >60  --  >60  GFRAA >60  --  >60    LIVER FUNCTION TESTS: No results for input(s): BILITOT, AST, ALT, ALKPHOS, PROT, ALBUMIN in the last 8760 hours.  Assessment and Plan:  Left ICA posterior wall intracranial aneurysm s/p near complete obliteration using primary coiling 04/25/2018 by Dr. Corliss Skains. Patient's condition stable- still with mild headache. Right groin incision stable. Appreciate and agree with neurology management. IR to follow.   Electronically Signed: Elwin Mocha, PA-C 04/27/2018, 9:34 AM   I spent a total of 15 Minutes at the the patient's bedside AND on the patient's hospital floor or unit, greater than 50% of which was counseling/coordinating care for left ICA posterior wall intracranial aneurysm  s/p obliteration.

## 2018-04-27 NOTE — Progress Notes (Signed)
OT Cancellation Note  Patient Details Name: Clarence Larsen MRN: [id number] DOB: 04/17/1976   Cancelled Treatment:    Reason Eval/Treat Not Completed: Other (comment).  Patient asleep, spouse requested therapist to return later due to rough night.  Will attempt to visit as time allows. Will continue to follow.   Clarence Larsen, OT Acute Rehabilitation Services Pager [phone] Office [phone]   Clarence Larsen 04/27/2018, 1:44 PM

## 2018-04-27 NOTE — Progress Notes (Signed)
Post coiling day 2.  Patient complains of some headache.  No other neurologic symptoms.  No issues or problems overnight.  Afebrile.  Blood pressure and heart rate acceptable.  Urine output good.  Fluid balance mildly positive.  Awake and alert.  Oriented and appropriate.  Cranial nerve function intact aside from some mild left-sided ptosis.  Motor and sensory function extremities normal.  Chest and abdomen benign.  Follow-up head CT scan without evidence of infarct or further hemorrhage.  No evidence of hydrocephalus.  Coils appear to be well-positioned.  Overall doing well following coiling of large internal carotid artery aneurysm.  Continue efforts at mobilization.  Continue IV fluids.

## 2018-04-27 NOTE — Progress Notes (Signed)
Physical Therapy Treatment Patient Details Name: Clarence NurseLawrence Larsen MRN: [id number] DOB: 04/25/1976 Today's Date: 04/27/2018    History of Present Illness Patient is a [age] y.o. male who presents with sudden onset of a severe headache. CT reveals Intracranial Aneurysm Rupture suspected with basilar cistern predominant Subarachnoid Hemorrhage and small volume intraventricular blood. S/P near complete obliteration using primary coiling 04/25/2018 by Dr. Corliss Skainseveshwar.     PT Comments    Pt c/o 5/10 headache. Pt fatigued today reporting he didn't get much sleep last night. Pt able to complete stair negotiation and amb 500'+ with supervision. Acute PT to cont to follow.   Follow Up Recommendations  No PT follow up;Supervision - Intermittent     Equipment Recommendations  None recommended by PT    Recommendations for Other Services       Precautions / Restrictions Precautions Precautions: Fall Restrictions Weight Bearing Restrictions: No    Mobility  Bed Mobility Overal bed mobility: Needs Assistance Bed Mobility: Supine to Sit     Supine to sit: Supervision     General bed mobility comments: supervision for safety and lines dues to impulsivity  Transfers Overall transfer level: Needs assistance Equipment used: None Transfers: Sit to/from Stand Sit to Stand: Supervision         General transfer comment: supervision for line management  Ambulation/Gait Ambulation/Gait assistance: Min guard Gait Distance (Feet): 500 Feet Assistive device: None Gait Pattern/deviations: WFL(Within Functional Limits) Gait velocity: wfl Gait velocity interpretation: >2.62 ft/sec, indicative of community ambulatory General Gait Details: no episodes of LOB, steady, denies worsening of heacache   Stairs Stairs: Yes Stairs assistance: Min guard Stair Management: One rail Left Number of Stairs: 4 General stair comments: verbal cues for safety and to take it slow due to IV pole   Wheelchair  Mobility    Modified Rankin (Stroke Patients Only) Modified Rankin (Stroke Patients Only) Pre-Morbid Rankin Score: No symptoms Modified Rankin: No significant disability     Balance Overall balance assessment: Mild deficits observed, not formally tested                                          Cognition Arousal/Alertness: Awake/alert Behavior During Therapy: Flat affect Overall Cognitive Status: Within Functional Limits for tasks assessed                                 General Comments: pt with lethargy today from "rough" night of not sleeping, RN reports pt was detoxing from ETOH and marijuana      Exercises      General Comments General comments (skin integrity, edema, etc.): VSS      Pertinent Vitals/Pain Pain Assessment: 0-10 Pain Score: 5  Pain Descriptors / Indicators: Headache Pain Intervention(s): Limited activity within patient's tolerance    Home Living                      Prior Function            PT Goals (current goals can now be found in the care plan section) Progress towards PT goals: Progressing toward goals    Frequency    Min 4X/week      PT Plan Current plan remains appropriate    Co-evaluation              AM-PAC  PT "6 Clicks" Mobility   Outcome Measure  Help needed turning from your back to your side while in a flat bed without using bedrails?: None Help needed moving from lying on your back to sitting on the side of a flat bed without using bedrails?: None Help needed moving to and from a bed to a chair (including a wheelchair)?: None Help needed standing up from a chair using your arms (e.g., wheelchair or bedside chair)?: None Help needed to walk in hospital room?: A Little Help needed climbing 3-5 steps with a railing? : A Little 6 Click Score: 22    End of Session Equipment Utilized During Treatment: Gait belt Activity Tolerance: Patient tolerated treatment well Patient  left: in chair;with call bell/phone within reach Larsen Communication: Mobility status PT Visit Diagnosis: Unsteadiness on feet (R26.81)     Time: 3664-4034 PT Time Calculation (min) (ACUTE ONLY): 19 min  Charges:  $Gait Training: 8-22 mins                     Lewis Shock, PT, DPT Acute Rehabilitation Services Pager #: [phone] Office #: [phone]    Iona Hansen 04/27/2018, 1:11 PM

## 2018-04-27 NOTE — Progress Notes (Signed)
Transcranial Doppler  Date POD PCO2 HCT BP  MCA ACA PCA OPHT SIPH VERT Basilar  12/3 MR     Right  Left   41  56   -19  *   *  18   14  20    *  *   *  *   *  *    12/4 JE     Right  Left   37  32   -30  *   *  *   13  17   *  *   -14  -23   -43           Right  Left                                             Right  Left                                             Right  Left                                            Right  Left                                            Right  Left                                        MCA = Middle Cerebral Artery      OPHT = Opthalmic Artery     BASILAR = Basilar Artery   ACA = Anterior Cerebral Artery     SIPH = Carotid Siphon PCA = Posterior Cerebral Artery   VERT = Verterbral Artery                  (*- not visualized)  Normal MCA = 62+\-12 ACA = 50+\-12 PCA = 42+\-23   * = Unable to insonate. Farrel DemarkJill Eunice, RDMS, RVT   Blanch MediaMegan Riddle 04/27/2018 10:23 AM

## 2018-04-28 NOTE — Progress Notes (Addendum)
Providing Compassionate, Quality Care - Together   Subjective: Patient reports lingering headache. He reports walking the unit and bathing with the assistance of his wife. No issues overnight.  Objective: Vital signs in last 24 hours: Temp:  [98.4 F (36.9 C)-98.7 F (37.1 C)] 98.4 F (36.9 C) (12/05 0800) Pulse Rate:  [54-79] 66 (12/05 1008) Resp:  [16-17] 17 (12/04 1700) BP: (107-138)/(64-99) 113/81 (12/05 1008) SpO2:  [93 %-100 %] 97 % (12/05 1008)  Intake/Output from previous day: 12/04 0701 - 12/05 0700 In: 3809.4 [P.O.:480; I.V.:3329.4] Out: 1275 [Urine:1275] Intake/Output this shift: Total I/O In: 300 [I.V.:300] Out: 350 [Urine:350]  Neurologic: Alert and oriented X 3, normal strength and tone. Normal symmetric reflexes. Normal coordination and gait Cranial nerves: normal aside from mild left-sided ptosis Motor: Normal GI: normal findings: bowel sounds normal and soft, non-tender  Lab Results: Recent Labs    04/26/18 0709  WBC 10.1  HGB 16.3  HCT 50.2  PLT 160   BMET Recent Labs    04/26/18 0709  NA 136  K 3.3*  CL 99  CO2 26  GLUCOSE 125*  BUN <5*  CREATININE 0.69  CALCIUM 8.9    Studies/Results: Ct Head Wo Contrast  Result Date: 04/27/2018 CLINICAL DATA:  Follow-up coil embolization of ruptured PCOM aneurysm and subarachnoid hemorrhage. EXAM: CT HEAD WITHOUT CONTRAST TECHNIQUE: Contiguous axial images were obtained from the base of the skull through the vertex without intravenous contrast. COMPARISON:  CT HEAD April 25, 2018 FINDINGS: BRAIN: No intraparenchymal hemorrhage, mass effect nor midline shift. Mild residual ventriculomegaly, decreased from prior examination. Small amount of residual blood products fourth ventricle. Moderate volume subarachnoid hemorrhage decreased from prior examination. No intraparenchymal hemorrhage or acute large vascular territory collections. Narrowed though improved basal cisterns. No acute large vascular  territory infarcts. No abnormal extra-axial fluid collections. Basal cisterns are patent. VASCULAR: Streak artifact from large coil mass for treatment of LEFT PCOM origin aneurysm. SKULL/SOFT TISSUES: No skull fracture. No significant soft tissue swelling. ORBITS/SINUSES: The included ocular globes and orbital contents are normal.Mild lobulated paranasal sinus mucosal thickening. Mastoid air cells are well aerated. OTHER: None. IMPRESSION: 1. Resolving hydrocephalus. Small amount of residual intraventricular hemorrhage. 2. Moderate residual subarachnoid hemorrhage, decreased. Electronically Signed   By: Awilda Metro M.D.   On: 04/27/2018 05:19    Vas [place] Transcranial Doppler  Result Date: 04/27/2018 Summary: This was a normal transcranial Doppler study, with normal flow direction and velocity of all identified vessels of the anterior and posterior circulations, with no evidence of stenosis, vasospasm or occlusion. There was no evidence of intracranial disease.  Linited evaluation as absent occipital windows hence vertebrals and basilar arteries not studied *See table(s) above for measurements and observations.  Diagnosing physician: Delia Heady MD Electronically signed by Delia Heady MD on 04/27/2018 at 8:44:21 AM.    Final     Assessment/Plan:  Subarachnoid hemorrhage  S/P Left ICA posterior wall intracranial aneurysm with near complete obliteration using primary coiling on 04/25/2018 by Dr. Corliss Skains.  TCDs normal  04/27/18 CT scan demonstrates resolving hydrocephalus, small amount of residual IVH, and decreasing moderate SAH   LOS: 3 days   Doing well following coil of large ICA aneurysm. Continue mobilizing and IV fluids.  Val Eagle, DNP, AGNP-C Nurse Practitioner  [facility] [address], [address], [place], [place] [address] P: [phone]    F: [phone]  04/28/2018, 10:39 AM    Patient also seen examined today.  I agree with  the  above assessment and plan.

## 2018-04-28 NOTE — Progress Notes (Signed)
Referring Physician(s): Pool, Sherilyn Cooter  Supervising Physician: Julieanne Cotton  Patient Status:  [facility] - In-pt  Chief Complaint:  Subarachnoid hemorrhage - post day 4 S/P Left ICA posterior wall intracranial aneurysm s/p near complete obliteration using primary coiling 04/25/2018 by Dr. Corliss Skains.  Subjective:  He had a much more restful night. He still has some headache and the lights still bother him some. Overall he feels better.  Allergies: Patient has no known allergies.  Medications: Prior to Admission medications   Medication Sig Start Date End Date Taking? Authorizing Provider  aspirin 81 MG chewable tablet Chew 1 tablet (81 mg total) by mouth daily. Patient not taking: Reported on 04/25/2018 04/23/16   McKeag, Janine Ores, MD  metoprolol succinate (TOPROL-XL) 25 MG 24 hr tablet Take 1 tablet (25 mg total) by mouth daily. Patient not taking: Reported on 04/25/2018 04/14/17   Berton Bon, MD  traMADol (ULTRAM) 50 MG tablet Take 1 tablet (50 mg total) by mouth every 6 (six) hours as needed. Patient not taking: Reported on 04/25/2018 02/13/15   Latrelle Dodrill, MD     Vital Signs: BP 113/81   Pulse 66   Temp 98.4 F (36.9 C) (Oral)   Resp 17   Ht 5\' 11"  (1.803 m)   Wt 104.3 kg   SpO2 97%   BMI 32.08 kg/m   Physical Exam Awake and alert and oriented No changes in neuro exam Right groin ok.  Imaging: Ct Angio Head W Or Wo Contrast  Result Date: 04/25/2018 CLINICAL DATA:  Subarachnoid hemorrhage. EXAM: CT ANGIOGRAPHY HEAD TECHNIQUE: Multidetector CT imaging of the head was performed using the standard protocol during bolus administration of intravenous contrast. Multiplanar CT image reconstructions and MIPs were obtained to evaluate the vascular anatomy. CONTRAST:  ISOVUE-370 IOPAMIDOL (ISOVUE-370) INJECTION 76% COMPARISON:  Head CT 04/25/2018.  No prior angiographic imaging. FINDINGS: Anterior circulation: The internal carotid arteries are widely  patent from the included cervical portions through the termini. The distal left cervical ICA is tortuous. There is a wide-neck aneurysm projecting posteriorly from the supraclinoid left ICA in the posterior communicating region measuring 16 x 9 x 11 mm. ACAs and MCAs are patent without evidence of proximal branch occlusion or flow limiting proximal stenosis. Mild diffuse branch vessel irregularity is considered artifactual. Posterior circulation: The visualized distal vertebral arteries are widely patent to the basilar and codominant. Right PICA and bilateral SCA is are grossly patent. The basilar artery and PCAs are patent with mild diffuse irregularity which may be artifactual. Venous sinuses: Limited assessment due to arterial dominant contrast timing. Grossly patent superior sagittal and straight sinuses. Anatomic variants: None. Delayed phase: Not performed. IMPRESSION: 1.6 cm left posterior communicating region aneurysm. Electronically Signed   By: Sebastian Ache M.D.   On: 04/25/2018 09:54   Ct Head Wo Contrast  Result Date: 04/27/2018 CLINICAL DATA:  Follow-up coil embolization of ruptured PCOM aneurysm and subarachnoid hemorrhage. EXAM: CT HEAD WITHOUT CONTRAST TECHNIQUE: Contiguous axial images were obtained from the base of the skull through the vertex without intravenous contrast. COMPARISON:  CT HEAD April 25, 2018 FINDINGS: BRAIN: No intraparenchymal hemorrhage, mass effect nor midline shift. Mild residual ventriculomegaly, decreased from prior examination. Small amount of residual blood products fourth ventricle. Moderate volume subarachnoid hemorrhage decreased from prior examination. No intraparenchymal hemorrhage or acute large vascular territory collections. Narrowed though improved basal cisterns. No acute large vascular territory infarcts. No abnormal extra-axial fluid collections. Basal cisterns are patent. VASCULAR: Streak artifact from  large coil mass for treatment of LEFT PCOM origin  aneurysm. SKULL/SOFT TISSUES: No skull fracture. No significant soft tissue swelling. ORBITS/SINUSES: The included ocular globes and orbital contents are normal.Mild lobulated paranasal sinus mucosal thickening. Mastoid air cells are well aerated. OTHER: None. IMPRESSION: 1. Resolving hydrocephalus. Small amount of residual intraventricular hemorrhage. 2. Moderate residual subarachnoid hemorrhage, decreased. Electronically Signed   By: Awilda Metroourtnay  Bloomer M.D.   On: 04/27/2018 05:19   Ct Head Wo Contrast  Result Date: 04/25/2018 CLINICAL DATA:  42 year old male with severe headache, syncopal episode after sexual intercourse this morning. EXAM: CT HEAD WITHOUT CONTRAST TECHNIQUE: Contiguous axial images were obtained from the base of the skull through the vertex without intravenous contrast. COMPARISON:  None. FINDINGS: Brain: Moderate volume hyperdense subarachnoid hemorrhage primarily involving the basilar cisterns. Slightly more blood in the left ambient cistern, and prominent posterior fossa SAH. Associated small volume of intraventricular hemorrhage most pronounced in the 3rd and 4th ventricles. Mild generalized ventriculomegaly. No acute cortically based infarct. No midline shift or other extra-axial blood or fluid collection. Vascular: No discrete abnormality. Skull: Negative. Sinuses/Orbits: Ethmoid mucosal thickening, other visible paranasal sinuses tympanic cavities and mastoids are well pneumatized. Other: Visualized orbits and scalp soft tissues are within normal limits. IMPRESSION: 1. Intracranial Aneurysm Rupture suspected with basilar cistern predominant Subarachnoid Hemorrhage and small volume intraventricular blood. 2. Mild ventriculomegaly. 3. Critical Value/emergent results were called by telephone at the time of interpretation on 04/25/2018 at 0813 hours to [facility], who verbally acknowledged these results. Electronically Signed   By: Odessa FlemingH  Hall M.D.   On: 04/25/2018 08:21   [place]  Transcranial Doppler  Result Date: 04/27/2018  Transcranial Doppler Indications: Subarachnoid hemorrhage. Performing Technologist: Farrel DemarkJill Eunice RDMS, RVT  Examination Guidelines: A complete evaluation includes B-mode imaging, spectral Doppler, color Doppler, and power Doppler as needed of all accessible portions of each vessel. Bilateral testing is considered an integral part of a complete examination. Limited examinations for reoccurring indications may be performed as noted.  +---------+-------------+----------+-----------+-----------+ RIGHT TCDRight VM (cm)Depth (cm)Pulsatility  Comment   +---------+-------------+----------+-----------+-----------+ MCA          37.00                 0.71                +---------+-------------+----------+-----------+-----------+ ACA         -30.00                 1.28                +---------+-------------+----------+-----------+-----------+ PCA                                        poor window +---------+-------------+----------+-----------+-----------+ Opthalmic    13.00                 1.18                +---------+-------------+----------+-----------+-----------+ Vertebral   -14.00                 0.87                +---------+-------------+----------+-----------+-----------+  +---------+------------+----------+-----------+-----------+ LEFT TCD Left VM (cm)Depth (cm)Pulsatility  Comment   +---------+------------+----------+-----------+-----------+ MCA         32.00  1.38                +---------+------------+----------+-----------+-----------+ ACA                                       poor window +---------+------------+----------+-----------+-----------+ PCA         32.00                 1.38                +---------+------------+----------+-----------+-----------+ Opthalmic   17.00                 1.67                +---------+------------+----------+-----------+-----------+ Vertebral    -23.00                 1.3                +---------+------------+----------+-----------+-----------+  +------------+-------+-------+             VM cm/sComment +------------+-------+-------+ Prox Basilar-43.00         +------------+-------+-------+ Dist Basilar-41.00         +------------+-------+-------+    Preliminary    [place]as [place] Transcranial Doppler  Result Date: 04/27/2018  Transcranial Doppler Indications: Subarachnoid hemorrhage. Performing Technologist: Blanch Media RVS Supporting Technologist: Gertie Fey RDMS, RVT, RDCS  Examination Guidelines: A complete evaluation includes B-mode imaging, spectral Doppler, color Doppler, and power Doppler as needed of all accessible portions of each vessel. Bilateral testing is considered an integral part of a complete examination. Limited examinations for reoccurring indications may be performed as noted.  +----------+-------------+----------+-----------+-------+ RIGHT TCD Right VM (cm)Depth (cm)PulsatilityComment +----------+-------------+----------+-----------+-------+ MCA           41.00                 1.09            +----------+-------------+----------+-----------+-------+ ACA           19.00                 1.03            +----------+-------------+----------+-----------+-------+ Term ICA      36.00                 1.14            +----------+-------------+----------+-----------+-------+ Opthalmic     14.00                 1.07            +----------+-------------+----------+-----------+-------+ Distal ICA    16.00                 0.98            +----------+-------------+----------+-----------+-------+  +----------+------------+----------+-----------+-------+ LEFT TCD  Left VM (cm)Depth (cm)PulsatilityComment +----------+------------+----------+-----------+-------+ MCA          56.00                 1.16            +----------+------------+----------+-----------+-------+ Term ICA      40.00                 1.25            +----------+------------+----------+-----------+-------+ PCA          18.00  1.03            +----------+------------+----------+-----------+-------+ Opthalmic    20.00                 1.37            +----------+------------+----------+-----------+-------+ Distal ICA   20.00                 1.05            +----------+------------+----------+-----------+-------+  +----------------------+----+ Right Lindegaard Ratio2.56 +----------------------+----+ +---------------------+---+ Left Lindegaard Ratio2.8 +---------------------+---+  Summary: This was a normal transcranial Doppler study, with normal flow direction and velocity of all identified vessels of the anterior and posterior circulations, with no evidence of stenosis, vasospasm or occlusion. There was no evidence of intracranial disease.  Linited evaluation as absent occipital windows hence vertebrals and basilar arteries not studied *See table(s) above for measurements and observations.  Diagnosing physician: Delia Heady MD Electronically signed by Delia Heady MD on 04/27/2018 at 8:44:21 AM.    Final     Labs:  CBC: Recent Labs    04/25/18 0723 04/25/18 0730 04/26/18 0709  WBC 4.3  --  10.1  HGB 16.1 17.7* 16.3  HCT 50.8 52.0 50.2  PLT 176  --  160    COAGS: Recent Labs    04/25/18 0723  INR 1.08  APTT 28    BMP: Recent Labs    04/25/18 0723 04/25/18 0730 04/26/18 0709  NA 138 139 136  K 3.4* 3.4* 3.3*  CL 100 105 99  CO2 22  --  26  GLUCOSE 109* 107* 125*  BUN 7 7 <5*  CALCIUM 8.8*  --  8.9  CREATININE 0.80 0.80 0.69  GFRNONAA >60  --  >60  GFRAA >60  --  >60    LIVER FUNCTION TESTS: No results for input(s): BILITOT, AST, ALT, ALKPHOS, PROT, ALBUMIN in the last 8760 hours.  Assessment and Plan:  Left ICA posterior wall intracranial aneurysm s/p near complete obliteration using primary coiling 04/25/2018 by Dr. Corliss Skains.  Doing  better today.  Continue current care.  Electronically Signed: Gwynneth Macleod, PA-C 04/28/2018, 10:21 AM    I spent a total of 15 Minutes at the the patient's bedside AND on the patient's hospital floor or unit, greater than 50% of which was counseling/coordinating care for f/u after aneurysm coiling.

## 2018-04-29 ENCOUNTER — Inpatient Hospital Stay (HOSPITAL_COMMUNITY): Payer: 59

## 2018-04-29 ENCOUNTER — Encounter (HOSPITAL_COMMUNITY): Payer: Self-pay | Admitting: Interventional Radiology

## 2018-04-29 DIAGNOSIS — I609 Nontraumatic subarachnoid hemorrhage, unspecified: Secondary | ICD-10-CM

## 2018-04-29 LAB — BASIC METABOLIC PANEL
Anion gap: 9 (ref 5–15)
BUN: 6 mg/dL (ref 6–20)
CO2: 22 mmol/L (ref 22–32)
Calcium: 8.8 mg/dL — ABNORMAL LOW (ref 8.9–10.3)
Chloride: 106 mmol/L (ref 98–111)
Creatinine, Ser: 0.78 mg/dL (ref 0.61–1.24)
GFR calc Af Amer: >60 mL/min (ref >60)
GFR calc non Af Amer: >60 mL/min (ref >60)
GLUCOSE: 107 mg/dL — AB (ref 70–99)
Potassium: 3.5 mmol/L (ref 3.5–5.1)
Sodium: 137 mmol/L (ref 135–145)

## 2018-04-29 NOTE — Progress Notes (Signed)
   Providing Compassionate, Quality Care - Together   Subjective: Patient reports no issues overnight. Headache is improving. Worked with physical therapy this morning.  Objective: Vital signs in last 24 hours: Temp:  [98.1 F (36.7 C)-98.9 F (37.2 C)] 98.9 F (37.2 C) (12/06 0800) Pulse Rate:  [55-70] 57 (12/06 0800) Resp:  [12] 12 (12/06 0800) BP: (110-139)/(75-99) 129/96 (12/06 1040) SpO2:  [94 %-99 %] 99 % (12/06 0800)  Intake/Output from previous day: 12/05 0701 - 12/06 0700 In: 2327.9 [I.V.:2327.9] Out: 800 [Urine:800] Intake/Output this shift: Total I/O In: 675.4 [P.O.:400; I.V.:275.4] Out: -   Alert and oriented X 3, normal strength and tone. Normal symmetric reflexes. Normal coordination and gait CN II-XII normal aside from mild left-sided ptosis which appears to be improving MAE PERRLA  BMET Recent Labs    04/29/18 0340  NA 137  K 3.5  CL 106  CO2 22  GLUCOSE 107*  BUN 6  CREATININE 0.78  CALCIUM 8.8*    Assessment/Plan:  Subarachnoid hemorrhage  S/PLeft ICA posterior wall intracranial aneurysm with near complete obliteration using primary coiling on 04/25/2018 by Dr. Corliss Skainseveshwar.  TCDs normal   LOS: 4 days   Continue mobilizing and IV fluids.   Val EagleMeghan Everton Bertha, DNP, AGNP-C Nurse Practitioner  [facility] [address], [place], [place] [address] P: [phone]    F: [phone]  04/29/2018, 11:23 AM

## 2018-04-29 NOTE — Plan of Care (Signed)
  Problem: Activity: Goal: Risk for activity intolerance will decrease Outcome: Progressing  Pt ambulating around unit with no difficulties through out the day.    Problem: Self-Care: Goal: Ability to communicate needs accurately will improve Outcome: Progressing   Problem: Self-Care: Goal: Verbalization of feelings and concerns over difficulty with self-care will improve Outcome: Progressing  Pt requesting pain medication when needed and verbalizes understanding of current plan of care.

## 2018-04-29 NOTE — Progress Notes (Signed)
Referring Physician(s): Pool, Sherilyn Cooter  Supervising Physician: Julieanne Cotton  Patient Status:  [facility] - [facility]  Chief Complaint: "Headache"  Subjective:  Left ICA posterior wall intracranial aneurysm s/p near complete obliteration using primary coiling 04/25/2018 by Dr. Corliss Skains. Patient awake and alert laying in bed. Accompanied by wife at bedside. Still complains of headache, rated 6/10 at this time. RN reports she just gave him pain medications. Right groin incision c/d/i.   Allergies: Patient has no known allergies.  Medications: Prior to Admission medications   Medication Sig Start Date End Date Taking? Authorizing Provider  aspirin 81 MG chewable tablet Chew 1 tablet (81 mg total) by mouth daily. Patient not taking: Reported on 04/25/2018 04/23/16   McKeag, Janine Ores, MD  metoprolol succinate (TOPROL-XL) 25 MG 24 hr tablet Take 1 tablet (25 mg total) by mouth daily. Patient not taking: Reported on 04/25/2018 04/14/17   Berton Bon, MD  traMADol (ULTRAM) 50 MG tablet Take 1 tablet (50 mg total) by mouth every 6 (six) hours as needed. Patient not taking: Reported on 04/25/2018 02/13/15   Latrelle Dodrill, MD     Vital Signs: BP (!) 139/99 (BP Location: Right Arm)   Pulse (!) 57   Temp 98.9 F (37.2 C)   Resp 12   Ht 5\' 11"  (1.803 m)   Wt 230 lb (104.3 kg)   SpO2 99%   BMI 32.08 kg/m   Physical Exam  Constitutional: He appears well-developed and well-nourished. No distress.  Pulmonary/Chest: Effort normal. No respiratory distress.  Neurological:  Alert, awake, and oriented x3. Speech and comprehension intact. PERRL bilaterally. EOMs intact bilaterally without nystagmus or subjective diplopia. Visual fields not assessed. No facial asymmetry. Tongue midline. Motor power symmetric proportional to effort. No pronator drift. Fine motor and coordination not assessed. Gait not assessed. Romberg not assessed. Heel to toe not assessed. Distal pulses 2+  bilaterally.   Skin: Skin is warm and dry.  Right groin incision soft without active bleeding or hematoma.  Psychiatric: He has a normal mood and affect. His behavior is normal. Judgment and thought content normal.  Nursing note and vitals reviewed.   Imaging: Ct Head Wo Contrast  Result Date: 04/27/2018 CLINICAL DATA:  Follow-up coil embolization of ruptured PCOM aneurysm and subarachnoid hemorrhage. EXAM: CT HEAD WITHOUT CONTRAST TECHNIQUE: Contiguous axial images were obtained from the base of the skull through the vertex without intravenous contrast. COMPARISON:  CT HEAD April 25, 2018 FINDINGS: BRAIN: No intraparenchymal hemorrhage, mass effect nor midline shift. Mild residual ventriculomegaly, decreased from prior examination. Small amount of residual blood products fourth ventricle. Moderate volume subarachnoid hemorrhage decreased from prior examination. No intraparenchymal hemorrhage or acute large vascular territory collections. Narrowed though improved basal cisterns. No acute large vascular territory infarcts. No abnormal extra-axial fluid collections. Basal cisterns are patent. VASCULAR: Streak artifact from large coil mass for treatment of LEFT PCOM origin aneurysm. SKULL/SOFT TISSUES: No skull fracture. No significant soft tissue swelling. ORBITS/SINUSES: The included ocular globes and orbital contents are normal.Mild lobulated paranasal sinus mucosal thickening. Mastoid air cells are well aerated. OTHER: None. IMPRESSION: 1. Resolving hydrocephalus. Small amount of residual intraventricular hemorrhage. 2. Moderate residual subarachnoid hemorrhage, decreased. Electronically Signed   By: Awilda Metro M.D.   On: 04/27/2018 05:19   [place]as [place] Transcranial Doppler  Result Date: 04/27/2018  Transcranial Doppler Indications: Subarachnoid hemorrhage. Performing Technologist: Farrel Demark RDMS, RVT  Examination Guidelines: A complete evaluation includes B-mode imaging, spectral Doppler, color  Doppler, and power Doppler  as needed of all accessible portions of each vessel. Bilateral testing is considered an integral part of a complete examination. Limited examinations for reoccurring indications may be performed as noted.  +---------+-------------+----------+-----------+-----------+ RIGHT TCDRight VM (cm)Depth (cm)Pulsatility  Comment   +---------+-------------+----------+-----------+-----------+ MCA          37.00                 0.71                +---------+-------------+----------+-----------+-----------+ ACA         -30.00                 1.28                +---------+-------------+----------+-----------+-----------+ PCA                                        poor window +---------+-------------+----------+-----------+-----------+ Opthalmic    13.00                 1.18                +---------+-------------+----------+-----------+-----------+ Vertebral   -14.00                 0.87                +---------+-------------+----------+-----------+-----------+  +---------+------------+----------+-----------+-----------+ LEFT TCD Left VM (cm)Depth (cm)Pulsatility  Comment   +---------+------------+----------+-----------+-----------+ MCA         32.00                 1.38                +---------+------------+----------+-----------+-----------+ ACA                                       poor window +---------+------------+----------+-----------+-----------+ PCA         32.00                 1.38                +---------+------------+----------+-----------+-----------+ Opthalmic   17.00                 1.67                +---------+------------+----------+-----------+-----------+ Vertebral   -23.00                 1.3                +---------+------------+----------+-----------+-----------+  +------------+-------+-------+             VM cm/sComment +------------+-------+-------+ Prox Basilar-43.00          +------------+-------+-------+ Dist Basilar-41.00         +------------+-------+-------+    Preliminary    [place] Transcranial Doppler  Result Date: 04/27/2018  Transcranial Doppler Indications: Subarachnoid hemorrhage. Performing Technologist: Blanch MediaMegan Riddle RVS Supporting Technologist: Gertie FeyMichelle Simonetti RDMS, RVT, RDCS  Examination Guidelines: A complete evaluation includes B-mode imaging, spectral Doppler, color Doppler, and power Doppler as needed of all accessible portions of each vessel. Bilateral testing is considered an integral part of a complete examination. Limited examinations for reoccurring indications may be performed as noted.  +----------+-------------+----------+-----------+-------+ RIGHT TCD Right VM (cm)Depth (cm)PulsatilityComment +----------+-------------+----------+-----------+-------+ MCA  41.00                 1.09            +----------+-------------+----------+-----------+-------+ ACA           19.00                 1.03            +----------+-------------+----------+-----------+-------+ Term ICA      36.00                 1.14            +----------+-------------+----------+-----------+-------+ Opthalmic     14.00                 1.07            +----------+-------------+----------+-----------+-------+ Distal ICA    16.00                 0.98            +----------+-------------+----------+-----------+-------+  +----------+------------+----------+-----------+-------+ LEFT TCD  Left VM (cm)Depth (cm)PulsatilityComment +----------+------------+----------+-----------+-------+ MCA          56.00                 1.16            +----------+------------+----------+-----------+-------+ Term ICA     40.00                 1.25            +----------+------------+----------+-----------+-------+ PCA          18.00                 1.03            +----------+------------+----------+-----------+-------+ Opthalmic    20.00                  1.37            +----------+------------+----------+-----------+-------+ Distal ICA   20.00                 1.05            +----------+------------+----------+-----------+-------+  +----------------------+----+ Right Lindegaard Ratio2.56 +----------------------+----+ +---------------------+---+ Left Lindegaard Ratio2.8 +---------------------+---+  Summary: This was a normal transcranial Doppler study, with normal flow direction and velocity of all identified vessels of the anterior and posterior circulations, with no evidence of stenosis, vasospasm or occlusion. There was no evidence of intracranial disease.  Linited evaluation as absent occipital windows hence vertebrals and basilar arteries not studied *See table(s) above for measurements and observations.  Diagnosing physician: Delia Heady MD Electronically signed by Delia Heady MD on 04/27/2018 at 8:44:21 AM.    Final     Labs:  CBC: Recent Labs    04/25/18 0723 04/25/18 0730 04/26/18 0709  WBC 4.3  --  10.1  HGB 16.1 17.7* 16.3  HCT 50.8 52.0 50.2  PLT 176  --  160    COAGS: Recent Labs    04/25/18 0723  INR 1.08  APTT 28    BMP: Recent Labs    04/25/18 0723 04/25/18 0730 04/26/18 0709 04/29/18 0340  NA 138 139 136 137  K 3.4* 3.4* 3.3* 3.5  CL 100 105 99 106  CO2 22  --  26 22  GLUCOSE 109* 107* 125* 107*  BUN 7 7 <5* 6  CALCIUM 8.8*  --  8.9 8.8*  CREATININE 0.80 0.80 0.69 0.78  GFRNONAA >60  --  >  60 >60  GFRAA >60  --  >60 >60    LIVER FUNCTION TESTS: No results for input(s): BILITOT, AST, ALT, ALKPHOS, PROT, ALBUMIN in the last 8760 hours.  Assessment and Plan:  Left ICA posterior wall intracranial aneurysm s/p near complete obliteration using primary coiling 04/25/2018 by Dr. Corliss Skains. Patient's condition stable- still with headache. Right groin incision stable. Appreciate and agree with neurology management. IR to follow.   Electronically Signed: Elwin Mocha,  PA-C 04/29/2018, 9:44 AM   I spent a total of 15 Minutes at the the patient's bedside AND on the patient's hospital floor or unit, greater than 50% of which was counseling/coordinating care for left ICA posterior wall intracranial aneurysm s/p obliteration.

## 2018-04-29 NOTE — Progress Notes (Signed)
Transcranial Doppler  Date POD PCO2 HCT BP  MCA ACA PCA OPHT SIPH VERT Basilar  12/3 MR     Right  Left   41  56   -19  *   *  18   14  20    *  *   *  *   *  *    12/4 JE     Right  Left   37  32   -30  *   *  *   13  17   *  *   -14  -23   -43      12/6 MS     Right  Left   50  70   -26  -50   16  *   15  27   *  *   -15  -16   *            Right  Left                                             Right  Left                                            Right  Left                                            Right  Left                                        MCA = Middle Cerebral Artery      OPHT = Opthalmic Artery     BASILAR = Basilar Artery   ACA = Anterior Cerebral Artery     SIPH = Carotid Siphon PCA = Posterior Cerebral Artery   VERT = Verterbral Artery                  (*- not visualized)  Normal MCA = 62+\-12 ACA = 50+\-12 PCA = 42+\-23   *Unable to insonate. Right Lindegaard ratio= 4.54, left Lindegaard ratio=4.38 04/29/2018 1:01 PM Clarence FeyMichelle Earvin Larsen, MHA, RVT, RDCS, RDMS

## 2018-04-29 NOTE — Progress Notes (Signed)
Physical Therapy Treatment Patient Details Name: Clarence Larsen MRN: [id number] DOB: 1976-02-02 Today's Date: 04/29/2018    History of Present Illness Patient is a 42 y.o. male who presents with sudden onset of a severe headache. CT reveals Intracranial Aneurysm Rupture suspected with basilar cistern predominant Subarachnoid Hemorrhage and small volume intraventricular blood. S/P near complete obliteration using primary coiling 04/25/2018 by Dr. Estanislado Pandy.     PT Comments    Patient progressing well towards PT goals. Scored 23/24 on DGI demonstrating good balance with head turns, stepping over objects, stairs, changes in gait speed etc- indicating pt is not at risk for falls. Continues to have some impulsivity during mobility requiring supervision to manage lines. Pt with slight headache during session which did not worsen with mobility. Bp pre activity 123/80, post activity 129/96, with slight HA.  Encouraged pt to ambulate with nursing/wife a few times per day. Pt does not require skilled therapy services as pt functioning at supervision/Mod I level. Goals are adequate for d/c but not completely met as pt will not be independent due to line management. All education completed. Discharge from therapy.   Follow Up Recommendations  No PT follow up;Supervision - Intermittent     Equipment Recommendations  None recommended by PT    Recommendations for Other Services       Precautions / Restrictions Precautions Precautions: None Precaution Comments: watch BP Restrictions Weight Bearing Restrictions: No    Mobility  Bed Mobility Overal bed mobility: Needs Assistance Bed Mobility: Supine to Sit     Supine to sit: Supervision     General bed mobility comments: supervision for safety and lines dues to impulsivity  Transfers Overall transfer level: Needs assistance Equipment used: None Transfers: Sit to/from Stand Sit to Stand: Supervision         General transfer comment:  supervision for line management  Ambulation/Gait Ambulation/Gait assistance: Supervision Gait Distance (Feet): 550 Feet Assistive device: None Gait Pattern/deviations: WFL(Within Functional Limits) Gait velocity: wfl   General Gait Details: no episodes of LOB, steady, denies worsening of headache. tolerated higher level balance activities- see balance section.   Stairs   Stairs assistance: Supervision Stair Management: No rails;Alternating pattern Number of Stairs: 4 General stair comments: verbal cues for safety and to take it slow due to IV pole   Wheelchair Mobility    Modified Rankin (Stroke Patients Only) Modified Rankin (Stroke Patients Only) Pre-Morbid Rankin Score: No symptoms Modified Rankin: No significant disability     Balance Overall balance assessment: Needs assistance Sitting-balance support: Feet supported;No upper extremity supported Sitting balance-Leahy Scale: Good     Standing balance support: During functional activity Standing balance-Leahy Scale: Good                   Standardized Balance Assessment Standardized Balance Assessment : Dynamic Gait Index   Dynamic Gait Index Level Surface: Normal Change in Gait Speed: Normal Gait with Horizontal Head Turns: Normal Gait with Vertical Head Turns: Normal Gait and Pivot Turn: Normal Step Over Obstacle: Mild Impairment Step Around Obstacles: Normal Steps: Normal Total Score: 23      Cognition Arousal/Alertness: Awake/alert Behavior During Therapy: WFL for tasks assessed/performed Overall Cognitive Status: Within Functional Limits for tasks assessed                                        Exercises      General Comments General  comments (skin integrity, edema, etc.): HR not picking up great but read 140s bpm during mobility. RN aware. Bp pre activity 123/80, post activity 129/96, with slight HA      Pertinent Vitals/Pain Pain Assessment: Faces Faces Pain Scale:  Hurts a little bit Pain Location: head Pain Descriptors / Indicators: Headache Pain Intervention(s): Monitored during session    Home Living                      Prior Function            PT Goals (current goals can now be found in the care plan section) Progress towards PT goals: Goals met/education completed, patient discharged from PT    Frequency    Min 4X/week      PT Plan Current plan remains appropriate    Co-evaluation              AM-PAC PT "6 Clicks" Mobility   Outcome Measure  Help needed turning from your back to your side while in a flat bed without using bedrails?: None Help needed moving from lying on your back to sitting on the side of a flat bed without using bedrails?: None Help needed moving to and from a bed to a chair (including a wheelchair)?: None Help needed standing up from a chair using your arms (e.g., wheelchair or bedside chair)?: None Help needed to walk in hospital room?: None Help needed climbing 3-5 steps with a railing? : A Little 6 Click Score: 23    End of Session Equipment Utilized During Treatment: Gait belt Activity Tolerance: Patient tolerated treatment well Patient left: in chair;with call bell/phone within reach;with family/visitor present;with nursing/sitter in room Nurse Communication: Mobility status;Other (comment)(elevated BP) PT Visit Diagnosis: Unsteadiness on feet (R26.81)     Time: 9417-4081 PT Time Calculation (min) (ACUTE ONLY): 20 min  Charges:  $Neuromuscular Re-education: 8-22 mins                     Wray Kearns, PT, DPT Acute Rehabilitation Services Pager [phone] Office [phone]       [place] 04/29/2018, 12:19 PM

## 2018-04-30 NOTE — Progress Notes (Signed)
Subjective: Patient reports overall doing well has a headache but stable from yesterday not any worse denies any nausea vomiting  Objective: Vital signs in last 24 hours: Temp:  [98.4 F (36.9 C)-99.6 F (37.6 C)] 98.5 F (36.9 C) (12/07 0757) Pulse Rate:  [53-71] 56 (12/07 0600) Resp:  [12-16] 16 (12/06 2000) BP: (115-131)/(54-96) 115/54 (12/07 0400) SpO2:  [93 %-100 %] 97 % (12/07 0600)  Intake/Output from previous day: 12/06 0701 - 12/07 0700 In: 3236.5 [P.O.:982; I.V.:2254.5] Out: 950 [Urine:950] Intake/Output this shift: No intake/output data recorded.  awake alert oriented pupils equal strength 5 out of 5 no pronator drift  Lab Results: No results for input(s): WBC, HGB, HCT, PLT in the last 72 hours. BMET Recent Labs    04/29/18 0340  NA 137  K 3.5  CL 106  CO2 22  GLUCOSE 107*  BUN 6  CREATININE 0.78  CALCIUM 8.8*    Studies/Results: Vas [place] Transcranial Doppler  Result Date: 04/29/2018  Transcranial Doppler Indications: Subarachnoid hemorrhage. Performing Technologist: Gertie Fey MHA, RDMS, RVT, RDCS  Examination Guidelines: A complete evaluation includes B-mode imaging, spectral Doppler, color Doppler, and power Doppler as needed of all accessible portions of each vessel. Bilateral testing is considered an integral part of a complete examination. Limited examinations for reoccurring indications may be performed as noted.  +----------+-------------+----------+-----------+------------------+ RIGHT TCD Right VM (cm)Depth (cm)Pulsatility     Comment       +----------+-------------+----------+-----------+------------------+ MCA           50.00                 0.91                       +----------+-------------+----------+-----------+------------------+ ACA          -26.00                 1.12                       +----------+-------------+----------+-----------+------------------+ Term ICA      21.00                 0.97                        +----------+-------------+----------+-----------+------------------+ PCA           16.00                 1.24                       +----------+-------------+----------+-----------+------------------+ Opthalmic     15.00                 1.50                       +----------+-------------+----------+-----------+------------------+ ICA siphon                                  Unable to insonate +----------+-------------+----------+-----------+------------------+ Vertebral    -15.00                  0.8                       +----------+-------------+----------+-----------+------------------+ Distal ICA    11.00                                            +----------+-------------+----------+-----------+------------------+  +----------+------------+----------+-----------+------------------+  LEFT TCD  Left VM (cm)Depth (cm)Pulsatility     Comment       +----------+------------+----------+-----------+------------------+ MCA          70.00                 0.83                       +----------+------------+----------+-----------+------------------+ ACA          -50.00                0.80                       +----------+------------+----------+-----------+------------------+ Term ICA     59.00                 1.21                       +----------+------------+----------+-----------+------------------+ PCA                                        Unable to insonate +----------+------------+----------+-----------+------------------+ Opthalmic    27.00                 1.27                       +----------+------------+----------+-----------+------------------+ ICA siphon                                 Unable to insonate +----------+------------+----------+-----------+------------------+ Vertebral    -16.00                1.17                       +----------+------------+----------+-----------+------------------+ Distal ICA   16.00                                             +----------+------------+----------+-----------+------------------+  +------------+-------+------------------+             VM cm/s     Comment       +------------+-------+------------------+ Prox Basilar       Unable to insonate +------------+-------+------------------+ +----------------------+----+ Right Lindegaard Ratio4.54 +----------------------+----+ +---------------------+----+ Left Lindegaard Ratio4.38 +---------------------+----+    Preliminary     Assessment/Plan: Postop day 5 from coiling intracranial aneurysm post-bleed day 5. Overall patient stable neurologically unchanged no signs of vasospasm continue observation in the ICU  LOS: 5 days     Orel Cooler P 04/30/2018, 8:06 AM

## 2018-05-01 NOTE — Progress Notes (Signed)
During medication administration, the Fioricet dropped on the bathroom floor while he was trying to take it. I discarded it in the sharps.   I retrieved another from the pyxis and was administered.   Sherrie GeorgeMegan Rashanda Magloire, RN BSN

## 2018-05-01 NOTE — Progress Notes (Signed)
Referring Physician(s): Pool, Sherilyn Cooter  Supervising Physician: Julieanne Cotton  Patient Status:  Clarence [facility]en - In-pt  Chief Complaint: Follow up left ICA aneurysm s/p coiling 12/2 with Dr. Corliss Skains  Subjective:  Patient up walking around room and hallway, wife present at bedside. States still has headache but it has improved a lot and is currently minimal. Also c/o some lower back pain from being in the bed.   Allergies: Patient has no known allergies.  Medications: Prior to Admission medications   Medication Sig Start Date End Date Taking? Authorizing Provider  aspirin 81 MG chewable tablet Chew 1 tablet (81 mg total) by mouth daily. Patient not taking: Reported on 04/25/2018 04/23/16   McKeag, Janine Ores, MD  metoprolol succinate (TOPROL-XL) 25 MG 24 hr tablet Take 1 tablet (25 mg total) by mouth daily. Patient not taking: Reported on 04/25/2018 04/14/17   Berton Bon, MD  traMADol (ULTRAM) 50 MG tablet Take 1 tablet (50 mg total) by mouth every 6 (six) hours as needed. Patient not taking: Reported on 04/25/2018 02/13/15   Latrelle Dodrill, MD     Vital Signs: BP 121/72 (BP Location: Right Arm)   Pulse 70   Temp 98.4 F (36.9 C) (Oral)   Resp 16   Ht 5\' 11"  (1.803 m)   Wt 230 lb (104.3 kg)   SpO2 96%   BMI 32.08 kg/m   Physical Exam  Constitutional: He is oriented to person, place, and time. No distress.  HENT:  Head: Normocephalic.  Cardiovascular: Normal rate, regular rhythm and normal heart sounds.  Pulmonary/Chest: Effort normal and breath sounds normal.  Abdominal: Soft. He exhibits no distension. There is no tenderness.  Neurological: He is alert and oriented to person, place, and time. No cranial nerve deficit. Coordination normal.  Gait observed - no abnormalities noted.  Skin: Skin is warm and dry. He is not diaphoretic.  Right groin incision without erythema, edema, hematoma, bleeding, discharge or pain.  Psychiatric: He has a normal mood and affect.  His behavior is normal. Judgment and thought content normal.  Nursing note and vitals reviewed.   Imaging: Vas [place] Transcranial Doppler  Result Date: 04/30/2018  Transcranial Doppler Indications: Subarachnoid hemorrhage. Performing Technologist: Gertie Fey MHA, RDMS, RVT, RDCS  Examination Guidelines: A complete evaluation includes B-mode imaging, spectral Doppler, color Doppler, and power Doppler as needed of all accessible portions of each vessel. Bilateral testing is considered an integral part of a complete examination. Limited examinations for reoccurring indications may be performed as noted.  +----------+-------------+----------+-----------+------------------+ RIGHT TCD Right VM (cm)Depth (cm)Pulsatility     Comment       +----------+-------------+----------+-----------+------------------+ MCA           50.00                 0.91                       +----------+-------------+----------+-----------+------------------+ ACA          -26.00                 1.12                       +----------+-------------+----------+-----------+------------------+ Term ICA      21.00                 0.97                       +----------+-------------+----------+-----------+------------------+  PCA           16.00                 1.24                       +----------+-------------+----------+-----------+------------------+ Opthalmic     15.00                 1.50                       +----------+-------------+----------+-----------+------------------+ ICA siphon                                  Unable to insonate +----------+-------------+----------+-----------+------------------+ Vertebral    -15.00                  0.8                       +----------+-------------+----------+-----------+------------------+ Distal ICA    11.00                                            +----------+-------------+----------+-----------+------------------+   +----------+------------+----------+-----------+------------------+ LEFT TCD  Left VM (cm)Depth (cm)Pulsatility     Comment       +----------+------------+----------+-----------+------------------+ MCA          70.00                 0.83                       +----------+------------+----------+-----------+------------------+ ACA          -50.00                0.80                       +----------+------------+----------+-----------+------------------+ Term ICA     59.00                 1.21                       +----------+------------+----------+-----------+------------------+ PCA                                        Unable to insonate +----------+------------+----------+-----------+------------------+ Opthalmic    27.00                 1.27                       +----------+------------+----------+-----------+------------------+ ICA siphon                                 Unable to insonate +----------+------------+----------+-----------+------------------+ Vertebral    -16.00                1.17                       +----------+------------+----------+-----------+------------------+ Distal ICA   16.00                                            +----------+------------+----------+-----------+------------------+  +------------+-------+------------------+  VM cm/s     Comment       +------------+-------+------------------+ Prox Basilar       Unable to insonate +------------+-------+------------------+ +----------------------+----+ Right Lindegaard Ratio4.54 +----------------------+----+ +---------------------+----+ Left Lindegaard Ratio4.38 +---------------------+----+  Summary:  Normal mean flow velocities in majority of identified vessels of anterior and posterior circulation. Poor orbital windows does limit evaluation. Globally elevated pulsatility indexes suggest diffuse increase in intracranial pressure likely. No definite  evidence of vasospasm noted *See table(s) above for measurements and observations.  Diagnosing physician: Delia Heady MD Electronically signed by Delia Heady MD on 04/30/2018 at 1:46:13 PM.    Final     Labs:  CBC: Recent Labs    04/25/18 0723 04/25/18 0730 04/26/18 0709  WBC 4.3  --  10.1  HGB 16.1 17.7* 16.3  HCT 50.8 52.0 50.2  PLT 176  --  160    COAGS: Recent Labs    04/25/18 0723  INR 1.08  APTT 28    BMP: Recent Labs    04/25/18 0723 04/25/18 0730 04/26/18 0709 04/29/18 0340  NA 138 139 136 137  K 3.4* 3.4* 3.3* 3.5  CL 100 105 99 106  CO2 22  --  26 22  GLUCOSE 109* 107* 125* 107*  BUN 7 7 <5* 6  CALCIUM 8.8*  --  8.9 8.8*  CREATININE 0.80 0.80 0.69 0.78  GFRNONAA >60  --  >60 >60  GFRAA >60  --  >60 >60    LIVER FUNCTION TESTS: No results for input(s): BILITOT, AST, ALT, ALKPHOS, PROT, ALBUMIN in the last 8760 hours.  Assessment and Plan:  Patient s/p left ICA posterior wall intracranial aneurysm s/p near complete obliteration using primary coiling 04/25/18 with Dr. Corliss Skains.  Patient still with minimal headache, ambulating easily. No other deficits noted on exam today. Right groin incision site stable.   Appreciate and agree with neurology management. IR to continue to follow.  Electronically Signed: Villa Herb, PA-C 05/01/2018, 10:22 AM   I spent a total of 15 Minutes at the the patient's bedside AND on the patient's Larsen floor or unit, greater than 50% of which was counseling/coordinating care for follow up left ICA aneurysm s/p coiling.

## 2018-05-01 NOTE — Progress Notes (Signed)
Subjective: Patient reports doing well improved headache  Objective: Vital signs in last 24 hours: Temp:  [98.3 F (36.8 C)-98.5 F (36.9 C)] 98.3 F (36.8 C) (12/08 0400) Pulse Rate:  [55-73] 55 (12/08 0600) BP: (109-132)/(65-85) 122/78 (12/08 0000) SpO2:  [94 %-100 %] 95 % (12/08 0600)  Intake/Output from previous day: 12/07 0701 - 12/08 0700 In: 2587.9 [P.O.:240; I.V.:2347.9] Out: 1900 [Urine:1900] Intake/Output this shift: No intake/output data recorded.  awake alert neurologically nonfocal post-bleed day 6 post-coil day 6  Lab Results: No results for input(s): WBC, HGB, HCT, PLT in the last 72 hours. BMET Recent Labs    04/29/18 0340  NA 137  K 3.5  CL 106  CO2 22  GLUCOSE 107*  BUN 6  CREATININE 0.78  CALCIUM 8.8*    Studies/Results: Vas [place] Transcranial Doppler  Result Date: 04/30/2018  Transcranial Doppler Indications: Subarachnoid hemorrhage. Performing Technologist: Gertie FeyMichelle Simonetti MHA, RDMS, RVT, RDCS  Examination Guidelines: A complete evaluation includes B-mode imaging, spectral Doppler, color Doppler, and power Doppler as needed of all accessible portions of each vessel. Bilateral testing is considered an integral part of a complete examination. Limited examinations for reoccurring indications may be performed as noted.  +----------+-------------+----------+-----------+------------------+ RIGHT TCD Right VM (cm)Depth (cm)Pulsatility     Comment       +----------+-------------+----------+-----------+------------------+ MCA           50.00                 0.91                       +----------+-------------+----------+-----------+------------------+ ACA          -26.00                 1.12                       +----------+-------------+----------+-----------+------------------+ Term ICA      21.00                 0.97                       +----------+-------------+----------+-----------+------------------+ PCA           16.00                  1.24                       +----------+-------------+----------+-----------+------------------+ Opthalmic     15.00                 1.50                       +----------+-------------+----------+-----------+------------------+ ICA siphon                                  Unable to insonate +----------+-------------+----------+-----------+------------------+ Vertebral    -15.00                  0.8                       +----------+-------------+----------+-----------+------------------+ Distal ICA    11.00                                            +----------+-------------+----------+-----------+------------------+  +----------+------------+----------+-----------+------------------+  LEFT TCD  Left VM (cm)Depth (cm)Pulsatility     Comment       +----------+------------+----------+-----------+------------------+ MCA          70.00                 0.83                       +----------+------------+----------+-----------+------------------+ ACA          -50.00                0.80                       +----------+------------+----------+-----------+------------------+ Term ICA     59.00                 1.21                       +----------+------------+----------+-----------+------------------+ PCA                                        Unable to insonate +----------+------------+----------+-----------+------------------+ Opthalmic    27.00                 1.27                       +----------+------------+----------+-----------+------------------+ ICA siphon                                 Unable to insonate +----------+------------+----------+-----------+------------------+ Vertebral    -16.00                1.17                       +----------+------------+----------+-----------+------------------+ Distal ICA   16.00                                             +----------+------------+----------+-----------+------------------+  +------------+-------+------------------+             VM cm/s     Comment       +------------+-------+------------------+ Prox Basilar       Unable to insonate +------------+-------+------------------+ +----------------------+----+ Right Lindegaard Ratio4.54 +----------------------+----+ +---------------------+----+ Left Lindegaard Ratio4.38 +---------------------+----+  Summary:  Normal mean flow velocities in majority of identified vessels of anterior and posterior circulation. Poor orbital windows does limit evaluation. Globally elevated pulsatility indexes suggest diffuse increase in intracranial pressure likely. No definite evidence of vasospasm noted *See table(s) above for measurements and observations.  Diagnosing physician: Delia Heady MD Electronically signed by Delia Heady MD on 04/30/2018 at 1:46:13 PM.    Final     Assessment/Plan: Post-bleed day 6 post called a 6 doing well  LOS: 6 days     Cannon Quinton P 05/01/2018, 8:01 AM

## 2018-05-02 ENCOUNTER — Encounter (HOSPITAL_COMMUNITY): Payer: Self-pay

## 2018-05-02 ENCOUNTER — Inpatient Hospital Stay (HOSPITAL_COMMUNITY): Payer: 59

## 2018-05-02 DIAGNOSIS — I609 Nontraumatic subarachnoid hemorrhage, unspecified: Secondary | ICD-10-CM

## 2018-05-02 MED ORDER — NIMODIPINE 30 MG PO CAPS
60.0000 mg | ORAL_CAPSULE | ORAL | Status: DC
  Administered 2018-05-03 – 2018-05-05 (×16): 60 mg via ORAL
  Filled 2018-05-02 (×16): qty 2

## 2018-05-02 MED ORDER — TRAMADOL HCL 50 MG PO TABS
50.0000 mg | ORAL_TABLET | Freq: Four times a day (QID) | ORAL | Status: DC | PRN
  Administered 2018-05-02 – 2018-05-04 (×2): 100 mg via ORAL
  Filled 2018-05-02 (×2): qty 2
  Filled 2018-05-02: qty 1

## 2018-05-02 MED ORDER — CYCLOBENZAPRINE HCL 10 MG PO TABS
10.0000 mg | ORAL_TABLET | Freq: Three times a day (TID) | ORAL | Status: DC | PRN
  Administered 2018-05-03 – 2018-05-04 (×5): 10 mg via ORAL
  Filled 2018-05-02 (×5): qty 1

## 2018-05-02 NOTE — Progress Notes (Signed)
Overall doing well.  Minimal headache.  No neurologic symptoms otherwise.  Afebrile.  Vital signs are stable.  Patient awake and alert.  Oriented and appropriate.  Cranial nerve function intact except for stable left eye ptosis.  Motor 5/5.  No drift.  TCD's normal.  Overall doing well.  Transfer to stepdown.  Possible discharge later this week.

## 2018-05-02 NOTE — Progress Notes (Signed)
Transcranial Doppler  Date POD PCO2 HCT BP  MCA ACA PCA OPHT SIPH VERT Basilar  12/3 MR     Right  Left   41  56   -19  *   *  18   14  20    *  *   *  *   *  *    12/4 JE     Right  Left   37  32   -30  *   *  *   13  17   *  *   -14  -23   -43      12/6 MS     Right  Left   50  70   -26  -50   16  *   15  27   *  *   -15  -16   *       12/9 MR     Right  Left   61  76   *  *   16  48   14  *   *  *   *  *   *  *          Right  Left                                            Right  Left                                            Right  Left                                        MCA = Middle Cerebral Artery      OPHT = Opthalmic Artery     BASILAR = Basilar Artery   ACA = Anterior Cerebral Artery     SIPH = Carotid Siphon PCA = Posterior Cerebral Artery   VERT = Verterbral Artery                  (*- not visualized)  Normal MCA = 62+\-12 ACA = 50+\-12 PCA = 42+\-23   *Unable to insonate. Right Lindegaard ratio= 3.58, left Lindegaard ratio=4  Tamani Durney 05/02/2018 1:00 PM

## 2018-05-02 NOTE — Progress Notes (Signed)
Referring Physician(s): Jordan LikesPool, Sherilyn CooterHenry  Supervising Physician: Julieanne Cottoneveshwar, Sanjeev  [facility]  [facility]  Chief Complaint: Follow up left ICA aneurysm s/p coiling 12/2 with Dr. Corliss Skainseveshwar  Subjective:  Patient laying in bed, wife at bedside. He reports being a little sore after PT/OT today, reports headaches are now 2-4/10. Wife reports patient has been washing himself independently and is up walking around the hallways with her between PT.   Allergies: Patient has no known allergies.  Medications: Prior to Admission medications   Medication Sig Start Date End Date Taking? Authorizing Provider  aspirin 81 MG chewable tablet Chew 1 tablet (81 mg total) by mouth daily. Patient not taking: Reported on 04/25/2018 04/23/16   McKeag, Janine OresIan D, MD  metoprolol succinate (TOPROL-XL) 25 MG 24 hr tablet Take 1 tablet (25 mg total) by mouth daily. Patient not taking: Reported on 04/25/2018 04/14/17   Berton BonMikell, Asiyah Zahra, MD  traMADol (ULTRAM) 50 MG tablet Take 1 tablet (50 mg total) by mouth every 6 (six) hours as needed. Patient not taking: Reported on 04/25/2018 02/13/15   Latrelle DodrillMcIntyre, Brittany J, MD     Vital Signs: BP 114/70   Pulse 73   Temp 98.7 F (37.1 C) (Oral)   Resp 14   Ht 5\' 11"  (1.803 m)   Wt 230 lb (104.3 kg)   SpO2 95%   BMI 32.08 kg/m   Physical Exam  Constitutional: No distress.  Cardiovascular: Normal rate and regular rhythm.  Pulmonary/Chest: Effort normal.  Abdominal: Soft. He exhibits no distension.  Neurological: He is alert.  Skin: Skin is warm and dry. He is not diaphoretic.  Psychiatric: He has a normal mood and affect. His behavior is normal.  Nursing note and vitals reviewed.   Imaging: Vas [place] Transcranial Doppler  Result Date: 05/02/2018  Transcranial Doppler Indications: Subarachnoid hemorrhage. Performing Technologist: STURDIVANT, RITA, D  Examination Guidelines: A complete evaluation includes B-mode imaging, spectral Doppler, color Doppler, and  power Doppler as needed of all accessible portions of each vessel. Bilateral testing is considered an integral part of a complete examination. Limited examinations for reoccurring indications may be performed as noted.  +----------+-------------+----------+-----------+-------+ RIGHT TCD Right VM (cm)Depth (cm)PulsatilityComment +----------+-------------+----------+-----------+-------+ MCA           61.00                 0.76            +----------+-------------+----------+-----------+-------+ PCA           16.00                 1.07            +----------+-------------+----------+-----------+-------+ Opthalmic     14.00                 1.02            +----------+-------------+----------+-----------+-------+ Distal ICA    17.00                 1.27            +----------+-------------+----------+-----------+-------+  +----------+------------+----------+-----------+-------+ LEFT TCD  Left VM (cm)Depth (cm)PulsatilityComment +----------+------------+----------+-----------+-------+ MCA          76.00                 1.03            +----------+------------+----------+-----------+-------+ PCA          48.00  0.80            +----------+------------+----------+-----------+-------+ ICA siphon   47.00                 0.98            +----------+------------+----------+-----------+-------+ Distal ICA   19.00                 1.58            +----------+------------+----------+-----------+-------+  Summary:  Suboptimal study due to absent occipital windows and poor transtemporal windows. Normal mean flow velocities in majority of identified vessels of anterior circulation. No definite evidence of vasospasm noted in the vessels studied. *See table(s) above for measurements and observations.  Diagnosing physician: Delia Heady MD Electronically signed by Delia Heady MD on 05/02/2018 at 3:22:51 PM.    Final    Vas [place] Transcranial Doppler  Result Date:  04/30/2018  Transcranial Doppler Indications: Subarachnoid hemorrhage. Performing Technologist: Gertie Fey MHA, RDMS, RVT, RDCS  Examination Guidelines: A complete evaluation includes B-mode imaging, spectral Doppler, color Doppler, and power Doppler as needed of all accessible portions of each vessel. Bilateral testing is considered an integral part of a complete examination. Limited examinations for reoccurring indications may be performed as noted.  +----------+-------------+----------+-----------+------------------+ RIGHT TCD Right VM (cm)Depth (cm)Pulsatility     Comment       +----------+-------------+----------+-----------+------------------+ MCA           50.00                 0.91                       +----------+-------------+----------+-----------+------------------+ ACA          -26.00                 1.12                       +----------+-------------+----------+-----------+------------------+ Term ICA      21.00                 0.97                       +----------+-------------+----------+-----------+------------------+ PCA           16.00                 1.24                       +----------+-------------+----------+-----------+------------------+ Opthalmic     15.00                 1.50                       +----------+-------------+----------+-----------+------------------+ ICA siphon                                  Unable to insonate +----------+-------------+----------+-----------+------------------+ Vertebral    -15.00                  0.8                       +----------+-------------+----------+-----------+------------------+ Distal ICA    11.00                                            +----------+-------------+----------+-----------+------------------+  +----------+------------+----------+-----------+------------------+  LEFT TCD  Left VM (cm)Depth (cm)Pulsatility     Comment        +----------+------------+----------+-----------+------------------+ MCA          70.00                 0.83                       +----------+------------+----------+-----------+------------------+ ACA          -50.00                0.80                       +----------+------------+----------+-----------+------------------+ Term ICA     59.00                 1.21                       +----------+------------+----------+-----------+------------------+ PCA                                        Unable to insonate +----------+------------+----------+-----------+------------------+ Opthalmic    27.00                 1.27                       +----------+------------+----------+-----------+------------------+ ICA siphon                                 Unable to insonate +----------+------------+----------+-----------+------------------+ Vertebral    -16.00                1.17                       +----------+------------+----------+-----------+------------------+ Distal ICA   16.00                                            +----------+------------+----------+-----------+------------------+  +------------+-------+------------------+             VM cm/s     Comment       +------------+-------+------------------+ Prox Basilar       Unable to insonate +------------+-------+------------------+ +----------------------+----+ Right Lindegaard Ratio4.54 +----------------------+----+ +---------------------+----+ Left Lindegaard Ratio4.38 +---------------------+----+  Summary:  Normal mean flow velocities in majority of identified vessels of anterior and posterior circulation. Poor orbital windows does limit evaluation. Globally elevated pulsatility indexes suggest diffuse increase in intracranial pressure likely. No definite evidence of vasospasm noted *See table(s) above for measurements and observations.  Diagnosing physician: Delia Heady MD Electronically  signed by Delia Heady MD on 04/30/2018 at 1:46:13 PM.    Final     Labs:  CBC: Recent Labs    04/25/18 0723 04/25/18 0730 04/26/18 0709  WBC 4.3  --  10.1  HGB 16.1 17.7* 16.3  HCT 50.8 52.0 50.2  PLT 176  --  160    COAGS: Recent Labs    04/25/18 0723  INR 1.08  APTT 28    BMP: Recent Labs    04/25/18 0723 04/25/18 0730 04/26/18 0709 04/29/18 0340  NA 138 139 136 137  K 3.4* 3.4* 3.3* 3.5  CL 100 105 99  106  CO2 22  --  26 22  GLUCOSE 109* 107* 125* 107*  BUN 7 7 <5* 6  CALCIUM 8.8*  --  8.9 8.8*  CREATININE 0.80 0.80 0.69 0.78  GFRNONAA >60  --  >60 >60  GFRAA >60  --  >60 >60    LIVER FUNCTION TESTS: No results for input(s): BILITOT, AST, ALT, ALKPHOS, PROT, ALBUMIN in the last 8760 hours.  Assessment and Plan:  Patient s/p left ICA posterior wall intracranial aneurysm s/p near complete obliteration using primary coiling 04/25/18 with Dr. Corliss Skains.  Headaches improving, for repeat TCD today - previously normal. Ambulating easily, working with PT.   Appreciate and agree with neurology management. IR to continue to follow.   Electronically Signed: Villa Herb, PA-C 05/02/2018, 5:02 PM   I spent a total of 15 Minutes at the the patient's bedside AND on the patient's hospital floor or unit, greater than 50% of which was counseling/coordinating care for left ICA aneurysm s/p coiling.

## 2018-05-02 NOTE — Progress Notes (Addendum)
Occupational Therapy Treatment Patient Details Name: Clarence Larsen MRN: [id number] DOB: 05-Aug-1975 Today's Date: 05/02/2018    History of present illness Patient is a 42 y.o. male who presents with sudden onset of a severe headache. CT reveals Intracranial Aneurysm Rupture suspected with basilar cistern predominant Subarachnoid Hemorrhage and small volume intraventricular blood. S/P near complete obliteration using primary coiling 04/25/2018 by Dr. Estanislado Pandy.    OT comments  Pt presents supine in bed pleasant and agreeable to working with therapy this afternoon. Pt performing all mobility (pushing IV pole) at overall supervision level. Pt performing higher level cognitive tasks while completing mobility without LOB noted. Pt demonstrating standing grooming and UB ADL at supervision level. He continues to require minor assist (minA) for LB ADL secondary to lower back/muscle stiffness today, though spouse present and reports family able to assist PRN; reports pt has overall been performing ADLs the past few days without external assist. Pt does report increased stiffness due to feeling as if he "overdid it" this past weekend with activity. Educated pt/pt's spouse on activity progression and returning to daily activities after discharge with pt verbalizing understanding. Questions answered throughout session. Encouraged continued mobility and performing ADLs with supervision from spouse. Feel pt's progress towards OT goals are adequate for discharge at this time. Acute OT to sign off. Thank you for this referral; please re-consult if pt's needs change.   Follow Up Recommendations  No OT follow up    Equipment Recommendations  None recommended by OT          Precautions / Restrictions Precautions Precautions: None Restrictions Weight Bearing Restrictions: No       Mobility Bed Mobility Overal bed mobility: Modified Independent Bed Mobility: Supine to Sit;Sit to Supine     Supine to sit:  Modified independent (Device/Increase time) Sit to supine: Modified independent (Device/Increase time)   General bed mobility comments: no physical assist required; minor supervision for line management  Transfers Overall transfer level: Modified independent Equipment used: None Transfers: Sit to/from Stand Sit to Stand: Modified independent (Device/Increase time)              Balance Overall balance assessment: Mild deficits observed, not formally tested                                         ADL either performed or assessed with clinical judgement   ADL Overall ADL's : Needs assistance/impaired     Grooming: Supervision/safety;Standing;Wash/dry hands                   Toilet Transfer: Supervision/safety;Ambulation;Regular Toilet           Functional mobility during ADLs: Supervision/safety General ADL Comments: pt performing room and hallway level mobility, performing higher level cognitive tasks during mobility including counting backwards from 100 by 5's and by 3's. Pt able to do so without significant difficulty. Also held discussion with both pt/pt's spouse regarding activity progression and return to activity as pt reports feeling that he "overdid it" this weekend with moving and is feeling sore from it. Pt continues to require some assist for LB dressing, however has family to assist and feel this was difficult today moreso due to pt's muscle stiffness     Vision       Perception     Praxis      Cognition Arousal/Alertness: Awake/alert Behavior During Therapy: [facility] for tasks assessed/performed  Overall Cognitive Status: Within Functional Limits for tasks assessed                                 General Comments: pt performing higher level cognitive tasks during session without significant difficulty        Exercises     Shoulder Instructions       General Comments VSS    Pertinent Vitals/ Pain       Pain  Assessment: Faces Faces Pain Scale: Hurts even more Pain Location: lower back/sacral region Pain Descriptors / Indicators: Cramping;Sore Pain Intervention(s): Monitored during session;Limited activity within patient's tolerance;Repositioned  Home Living                                          Prior Functioning/Environment              Frequency  Min 2X/week        Progress Toward Goals  OT Goals(current goals can now be found in the care plan section)  Progress towards OT goals: Goals met/education completed, patient discharged from [facility] for discharge )  Acute Rehab OT Goals Patient Stated Goal: to get home  OT Goal Formulation: All assessment and education complete, DC therapy  Plan All goals met and education completed, patient discharged from OT services    Co-evaluation                 AM-PAC OT "6 Clicks" Daily Activity     Outcome Measure   Help from another person eating meals?: None Help from another person taking care of personal grooming?: None Help from another person toileting, which includes using toliet, bedpan, or urinal?: None Help from another person bathing (including washing, rinsing, drying)?: A Little Help from another person to put on and taking off regular upper body clothing?: None Help from another person to put on and taking off regular lower body clothing?: A Little 6 Click Score: 22    End of Session Equipment Utilized During Treatment: Gait belt  OT Visit Diagnosis: Other abnormalities of gait and mobility (R26.89);Unsteadiness on feet (R26.81);Pain   Activity Tolerance Patient tolerated treatment well   Patient Left in bed;with call bell/phone within reach;with family/visitor present   Nurse Communication Mobility status        Time: 1510-1530 OT Time Calculation (min): 20 min  Charges: OT General Charges $OT Visit: 1 Visit OT Treatments $Self Care/Home Management : 8-22 mins  Lou Cal, OT [facility] Services Pager [phone] Office [place] 05/02/2018, 5:00 PM

## 2018-05-03 NOTE — Progress Notes (Signed)
CM called [facility] to see the cost to the patient for:   Nimodipine 60 mg every 4 hours.   Per [facility] they do the 30 mg tabs only  His co pay would be : $15.00 This is a tier 1 medication

## 2018-05-03 NOTE — Care Management (Signed)
#    4.  S/W   [facility]NEY @ [facility]  # [phone]   NIMOTOP  60 MG EVERY  H HRS COVER- NONE FORMULARY PRIOR APPROVAL- YES # [phone]   PREFERRED PHARMACY : YES - [facility]

## 2018-05-03 NOTE — Progress Notes (Signed)
Providing Compassionate, Quality Care - Together   Subjective: Patient reports minimal headache. Chief complaint at this time back pain and stiffness, which has been a longstanding problem for him. He feels the hospital bed is contributing to his discomfort. He would like to be discharged home tomorrow if his TCD is normal.  Objective: Vital signs in last 24 hours: Temp:  [98.2 F (36.8 C)-99 F (37.2 C)] 98.7 F (37.1 C) (12/10 1647) Pulse Rate:  [55-72] 65 (12/10 1647) Resp:  [14-20] 16 (12/10 1647) BP: (92-129)/(60-80) 92/64 (12/10 1647) SpO2:  [95 %-100 %] 97 % (12/10 1647) Weight:  [102.7 kg] 102.7 kg (12/09 2239)  Intake/Output from previous day: 12/09 0701 - 12/10 0700 In: 1568.6 [P.O.:360; I.V.:1208.6] Out: 750 [Urine:750] Intake/Output this shift: No intake/output data recorded.  Patient awake and alert. Oriented x 4 Cranial nerve function intact except for stable left eye ptosis MAE 5/5, No drift  Lab Results: No results for input(s): WBC, HGB, HCT, PLT in the last 72 hours. BMET No results for input(s): NA, K, CL, CO2, GLUCOSE, BUN, CREATININE, CALCIUM in the last 72 hours.  Studies/Results: Vas [place] Transcranial Doppler  Result Date: 05/02/2018  Transcranial Doppler Indications: Subarachnoid hemorrhage. Performing Technologist: STURDIVANT, RITA, D  Examination Guidelines: A complete evaluation includes B-mode imaging, spectral Doppler, color Doppler, and power Doppler as needed of all accessible portions of each vessel. Bilateral testing is considered an integral part of a complete examination. Limited examinations for reoccurring indications may be performed as noted.  +----------+-------------+----------+-----------+-------+ RIGHT TCD Right VM (cm)Depth (cm)PulsatilityComment +----------+-------------+----------+-----------+-------+ MCA           61.00                 0.76            +----------+-------------+----------+-----------+-------+ PCA            16.00                 1.07            +----------+-------------+----------+-----------+-------+ Opthalmic     14.00                 1.02            +----------+-------------+----------+-----------+-------+ Distal ICA    17.00                 1.27            +----------+-------------+----------+-----------+-------+  +----------+------------+----------+-----------+-------+ LEFT TCD  Left VM (cm)Depth (cm)PulsatilityComment +----------+------------+----------+-----------+-------+ MCA          76.00                 1.03            +----------+------------+----------+-----------+-------+ PCA          48.00                 0.80            +----------+------------+----------+-----------+-------+ ICA siphon   47.00                 0.98            +----------+------------+----------+-----------+-------+ Distal ICA   19.00                 1.58            +----------+------------+----------+-----------+-------+  Summary:  Suboptimal study due to absent occipital windows and poor transtemporal windows. Normal mean flow velocities in majority of  identified vessels of anterior circulation. No definite evidence of vasospasm noted in the vessels studied. *See table(s) above for measurements and observations.  Diagnosing physician: Delia Heady MD Electronically signed by Delia Heady MD on 05/02/2018 at 3:22:51 PM.    Final     Assessment/Plan:  Subarachnoid hemorrhage  S/PLeft ICA posterior wall intracranial aneurysmwithnear complete obliteration using primary coiling on12/06/2017 by Dr. Corliss Skains.  TCDs normal    LOS: 8 days    Continue flexeril for back spasms. Discharge home pending normal TCD tomorrow.   Val Eagle, DNP, AGNP-C Nurse Practitioner  [facility] [address], [address], [place], [place] [address] P: [phone]    F: [phone]  05/03/2018, 5:51 PM

## 2018-05-03 NOTE — Progress Notes (Signed)
Patient ID: [name], male   DOB: 10/31/1975, 42 y.o.   MRN: [id number]   L ICA coiling embolization 04/25/18.  Pt discharging per notes soon  Will need to see Dr Corliss Skainseveshwar 2 weeks after DC. Orders in chart  Pt aware.

## 2018-05-04 ENCOUNTER — Inpatient Hospital Stay (HOSPITAL_COMMUNITY): Payer: 59

## 2018-05-04 DIAGNOSIS — I609 Nontraumatic subarachnoid hemorrhage, unspecified: Secondary | ICD-10-CM

## 2018-05-04 NOTE — Progress Notes (Signed)
Per MD noted plan is for d/c home tomorrow. If pt d/c on Nimodipine he can pick this up at his [facility] on [address]. They have enough of the med to get him started and will order the remaining needed.  Wife will provide supervision at home and transportation to home.

## 2018-05-04 NOTE — Progress Notes (Signed)
Patient refusing to wear telemetry box tonight.

## 2018-05-04 NOTE — Progress Notes (Signed)
Transcranial Doppler  Date POD PCO2 HCT BP  MCA ACA PCA OPHT SIPH VERT Basilar  12/3 MR     Right  Left   41  56   -19  *   *  18   14  20    *  *   *  *   *  *    12/4 JE     Right  Left   37  32   -30  *   *  *   13  17   *  *   -14  -23   -43      12/6 MS     Right  Left   50  70   -26  -50   16  *   15  27   *  *   -15  -16   *       12/9 MR     Right  Left   61  76   *  *   16  48   14  *   *  *   *  *   *  *    12/11 MS      Right  Left   63  71   -12  -36   *  *   12  12   *  51   -24  *   *           Right  Left                                            Right  Left                                        MCA = Middle Cerebral Artery      OPHT = Opthalmic Artery     BASILAR = Basilar Artery   ACA = Anterior Cerebral Artery     SIPH = Carotid Siphon PCA = Posterior Cerebral Artery   VERT = Verterbral Artery                  (*- not visualized)  Normal MCA = 62+\-12 ACA = 50+\-12 PCA = 42+\-23   *Unable to insonate. Right Lindegaard ratio= 3.58, left Lindegaard ratio=4  Megan Riddle 05/04/2018 10:49 AM   *Unable to insonate. Right Lindegaard ratio:4.5, Lt Lindegaard ratio:3.38  05/04/2018 11:02 AM Gertie FeyMichelle Rocky Rishel, MHA, RVT, RDCS, RDMS

## 2018-05-04 NOTE — Progress Notes (Signed)
Overall doing reasonably well.  Mild headache.  Still with some neck and lower back stiffness.  Patient awake and alert.  Oriented and appropriate.  Motor and sensory function intact.  Status post grade 1 subarachnoid hemorrhage.  Patient overall doing well.  Follow-up on TCD's today.  Likely discharge home tomorrow.

## 2018-05-05 ENCOUNTER — Encounter (HOSPITAL_COMMUNITY): Payer: Self-pay | Admitting: *Deleted

## 2018-05-05 ENCOUNTER — Other Ambulatory Visit: Payer: Self-pay

## 2018-05-05 MED ORDER — BUTALBITAL-APAP-CAFFEINE 50-325-40 MG PO TABS: 1.0000 | ORAL_TABLET | ORAL | 0 refills | Status: DC | PRN

## 2018-05-05 MED ORDER — NIMODIPINE 30 MG PO CAPS: 60.0000 mg | ORAL_CAPSULE | ORAL | 0 refills | Status: DC

## 2018-05-05 NOTE — Discharge Instructions (Signed)
Cerebral Aneurysm An aneurysm is the bulging or ballooning out of part of the weakened wall of a vein or artery. An aneurysm in the vein or artery of the brain is called a brain aneurysm, or cerebral aneurysm. Aneurysms are a risk to your health because they may leak or rupture. Once the aneurysm leaks or ruptures, bleeding occurs. If the bleeding occurs within the brain tissue, the condition is called an intracerebral hemorrhage. An intracerebral hemorrhage can result in a hemorrhagic stroke. If the bleeding occurs in the area between the brain and the thin tissues that cover the brain, the condition is called a subarachnoid hemorrhage. This increases the pressure on the brain and causes some areas of the brain to not get the necessary blood flow. The blood from the ruptured aneurysm collects and presses on the surrounding brain tissue. A subarachnoid hemorrhage can cause a stroke. A ruptured cerebral aneurysm is a medical emergency. This can cause permanent damage and loss of brain function. What are the causes? A cerebral aneurysm is caused when a weakened part of the blood vessel expands. The blood vessel expands due to the constant pressure from the flow of blood through the weakened blood vessel. Usually the aneurysm expands slowly. As the weakened aneurysm expands, the walls of the aneurysm become weaker. Aneurysms may be associated with diseases that weaken and damage the walls of your blood vessels or blood vessels that develop abnormally. Some known causes for cerebral aneurysms are:  Head trauma.  Infection.  Use of "recreational drugs" such as cocaine or amphetamines.  What increases the risk? People at risk for a cerebral aneurysm or hemorrhagic stroke usually have one or more risk factors, which include:  Having high blood pressure (hypertension).  Abusing alcohol.  Having abnormal blood vessels present since birth.  Having certain bleeding disorders, such as hemophilia, sickle  cell disease, or liver disease.  Taking blood thinners (anticoagulants).  Smoking.  Having a family history of aneurysm.  What are the signs or symptoms? The signs and symptoms of an unruptured cerebral aneurysm will partly depend on its size and rate of growth. A small, unchanging aneurysm generally does not produce symptoms. A larger aneurysm that is steadily growing can increase pressure on the brain or nerves. That increased pressure from the unruptured cerebral aneurysm can cause:  A headache.  Problems with your vision.  Numbness or weakness in an arm or leg.  Problems with memory.  Problems speaking.  Seizures.  If an aneurysm leaks or bursts, it can cause a stroke and be life-threatening. Symptoms may include:  A sudden, severe headache with no known cause. The headache is often described as the worst headache ever experienced.  Nausea or vomiting, especially when combined with other symptoms such as a headache.  Sudden weakness or numbness of the face, arm, or leg, especially on one side of the body.  Sudden trouble walking or difficulty moving arms or legs.  Sudden confusion.  Sudden personality changes.  Trouble speaking (aphasia) or understanding.  Difficulty swallowing.  Sudden trouble seeing in one or both eyes.  Double vision.  Dizziness.  Loss of balance or coordination.  Intolerance to light.  Stiff neck.  How is this diagnosed? Your health care provider may use one of the following tests to diagnose your aneurysm:  Computed tomographic angiography (CTA). This test uses dye and a scanner to produce images of your blood vessels.  Magnetic resonance angiography (MRA). This test uses an MRI machine to produce images of   your blood vessels.  Digital subtraction angiography (DSA). This test uses dye and X-rays to take images of your blood vessels. Your health care provider may use this test to help determine the best course of treatment.  How  is this treated? Unruptured Aneurysms Treatment is complex when an aneurysm is found and it is not causing problems. Treatment is very individualized, as each case is different. Many things must be considered, such as the size and exact location of your aneurysm, your age, your overall health, and your feelings and preferences. Small aneurysms in certain locations of the brain have a very low chance of bleeding or rupturing. These small aneurysms may not be treated. However, depending on the size and location of the aneurysm, treatments may be recommended and include:  Coiling. During this procedure, a catheter is inserted and advanced through a blood vessel. Once the catheter reaches the aneurysm, tiny coils are used to block blood flow into the aneurysm.  Surgical clipping. During surgery, a clip is placed at the base of the aneurysm. The clip prevents blood from continuing to enter the aneurysm.  Flow diversion. This procedure is used to divert blood flow around the aneurysm.  Ruptured Aneurysms Immediate emergency surgery may be needed to help prevent damage to the brain and to reduce the risk of rebleeding. Timing of treatment is an important factor in the prevention of complications. Successful early treatment of a ruptured aneurysm (within the first 3 days of a bleed) helps to prevent rebleeding and blood vessel spasm. In some cases, there may be a reason to treat later (10-14 days after a rupture). Many things are considered when making this decision, and each case is handled individually. Follow these instructions at home:  Take medicines only as instructed by your health care provider.  Eat healthy foods. It is recommended that you eat 5 or more servings of fruits and vegetables each day. Foods may need to be a special consistency (soft or pureed), or small bites may need to be taken if you have had a ruptured aneurysm or stroke. Certain dietary changes may be advised to address high blood  pressure, high cholesterol, diabetes, or obesity. ? Food choices that are low in salt (sodium), saturated fat, trans fat, and cholesterol are recommended to manage high blood pressure. ? Food choices that are high in fiber and low in saturated fat, trans fat, and cholesterol are recommended to control cholesterol levels. ? Controlling carbohydrate and sugar intake is recommended to manage diabetes. ? Reducing calorie intake and making food choices that are low in sodium, saturated fat, trans fat, and cholesterol are recommended to manage obesity.  Maintain a healthy weight.  Stay physically active. It is recommended that you get at least 30 minutes of activity on most or all days.  Do not smoke.  Limit alcohol use. Moderate alcohol use is considered to be: ? No more than 2 drinks each day for men. ? No more than 1 drink each day for nonpregnant women.  Stop drug abuse.  A safe home environment is important to reduce the risk of falls. Your health care provider may arrange for specialists to evaluate your home. Having grab bars in the bedroom and bathroom is often important. Your health care provider may arrange for special equipment to be used at home, such as raised toilets and a seat for the shower.  Physical, occupational, and speech therapy. Ongoing therapy may be needed to maximize your recovery after a ruptured aneurysm or stroke.   If you have been advised to use a walker or a cane, use it at all times. Be sure to keep your therapy appointments.  Follow all instructions for follow-up with your health care provider. This is very important. This includes any referrals, physical therapy, rehabilitation, and laboratory tests. Proper follow-up may prevent an aneurysm rupture or a stroke. Get help right away if:  You have a sudden, severe headache with no known cause.  You have sudden nausea or vomiting with a severe headache.  You have sudden weakness or numbness of the face, arm, or  leg, especially on one side of the body.  You have sudden trouble walking or difficulty moving arms or legs.  You have sudden confusion.  You have trouble speaking or understanding.  You have sudden trouble seeing in one or both eyes.  You have a sudden loss of balance or coordination.  You have a stiff neck.  You have difficulty breathing.  You have a partial or total loss of consciousness. Any of these symptoms may represent a serious problem that is an emergency. Do not wait to see if the symptoms will go away. Get medical help at once. Call your local emergency services (911 in [place] Do not drive yourself to the hospital. This information is not intended to replace advice given to you by your health care provider. Make sure you discuss any questions you have with your health care provider. Document Released: 01/31/2002 Document Revised: 10/17/2015 Document Reviewed: 10/27/2012 Elsevier Interactive Patient Education  2017 [employer]  

## 2018-05-05 NOTE — Progress Notes (Signed)
Pt. Being discharged home. Leaving unit via wheelchair. Education and instructions provided to patient and family. All belongings with patient.

## 2018-05-05 NOTE — Discharge Summary (Signed)
Physician Discharge Summary  Patient ID: Clarence Larsen MRN: [id number] DOB/AGE: 41/12/1975 41 y.o.  Admit date: 04/25/2018 Discharge date: 05/05/2018  Admission Diagnoses:  Discharge Diagnoses:  Active Problems:   Subarachnoid hemorrhage (HCC)   SAH (subarachnoid hemorrhage) (HCC)   Discharged Condition: good  Hospital Course: Patient admitted to the hospital for treatment of a subarachnoid hemorrhage secondary to a left-sided posterior communicating artery segment aneurysm.  Patient underwent uncomplicated coil packing of the aneurysm dome with subsequent pipeline embolization deferred to a later date.  He is been hemodynamically stable throughout.  He has had no neurologic difficulty except for some mild ptosis in his left eye.  He is up ambulating without difficulty.  Transcranial Dopplers demonstrate no evidence of spasm.  Patient is ready for discharge home.  Consults:   Significant Diagnostic Studies:   Treatments:   Discharge Exam: Blood pressure 107/69, pulse 67, temperature 99.2 F (37.3 C), temperature source Oral, resp. rate 20, height 5\' 11"  (1.803 m), weight 102.7 kg, SpO2 97 %. Awake and alert.  Oriented and appropriate.  Cranial nerve function intact aside from some left ptosis.  Motor and sensory function of the extremities normal.  Wound clean and dry.  Chest and abdomen benign.  Disposition: Discharge disposition: 01-Home or Self Care        Allergies as of 05/05/2018   No Known Allergies     Medication List    STOP taking these medications   traMADol 50 MG tablet Commonly known as:  ULTRAM     TAKE these medications   aspirin 81 MG chewable tablet Chew 1 tablet (81 mg total) by mouth daily.   butalbital-acetaminophen-caffeine 50-325-40 MG tablet Commonly known as:  FIORICET, ESGIC Take 1-2 tablets by mouth every 4 (four) hours as needed for headache.   metoprolol succinate 25 MG 24 hr tablet Commonly known as:  TOPROL-XL Take 1 tablet (25  mg total) by mouth daily.   niMODipine 30 MG capsule Commonly known as:  NIMOTOP Take 2 capsules (60 mg total) by mouth every 4 (four) hours for 10 days.      Follow-up Information    Julieanne Cottoneveshwar, Sanjeev, MD Follow up in 2 week(s).   Specialties:  Interventional Radiology, Radiology Why:  2 week follow up post DC-- pt will hear from scheduler fot time and date-- call [phone] if any questions Contact information: [address] [place] [address] [phone]        Julio SicksPool, Jamilet Ambroise, MD. Schedule an appointment as soon as possible for a visit in 2 week(s).   Specialty:  Neurosurgery Contact information: [address] [place] [address] [phone]           Signed: Temple PaciniHenry A Avari Gelles 05/05/2018, 10:03 AM

## 2018-05-31 ENCOUNTER — Ambulatory Visit (HOSPITAL_COMMUNITY)
Admission: RE | Admit: 2018-05-31 | Discharge: 2018-05-31 | Disposition: A | Discharge status: Home / Self Care | Payer: 59 | Source: Ambulatory Visit | Attending: Radiology | Admitting: Radiology

## 2018-05-31 DIAGNOSIS — I609 Nontraumatic subarachnoid hemorrhage, unspecified: Secondary | ICD-10-CM

## 2018-05-31 NOTE — Progress Notes (Signed)
Chief Complaint: Patient was seen in consultation today for subarachnoid hemorrhage secondary to ruptured left ICA posterior wall aneurysm s/p embolization.  Referring Physician(s): Pool, Anne Ng  Supervising Physician: Julieanne Cotton  Patient Status: [facility] - Out-pt  History of Present Illness: Clarence Larsen is a 43 y.o. male with a past medical history of high cholesterol, atrial fibrillation, subarachnoid hemorrhage (secondary to ruptured intracranial aneurysm) 04/2018, and current tobacco abuse. He is known to [facility] and has been followed by Dr. Corliss Skains since 04/2018. He first presented to our department at the request of Dr. Jordan Likes for management of subarachnoid hemorrhage secondary to a ruptured intracranial aneurysm. He underwent an image-guided cerebral arteriogram with embolization of his left ICA posterior wall aneurysm using primary coiling 04/25/2018 by Dr. Corliss Skains. Patient was discharged home 05/05/2018 in stable condition.  Patient presents today for follow-up regarding his recent procedure 04/25/2018. Patient awake and alert sitting in chair. Accompanied by wife. Complains of intermittent "slight headaches". States that his first week home, he had daily headaches that have subsided. Now he states headaches are less intense and frequent. States he does not need medications for headaches because they are so mild. Denies weakness, numbness/tingling, dizziness, vision changes, hearing changes, tinnitus, speech difficulty, or memory loss/issues.  Patient is currently taking Aspirin 81 mg once daily.   No past medical history on file.  Past Surgical History:  Procedure Laterality Date  . IR ANGIO INTRA EXTRACRAN SEL COM CAROTID INNOMINATE UNI R MOD SED  04/25/2018  . IR ANGIO INTRA EXTRACRAN SEL INTERNAL CAROTID UNI L MOD SED  04/25/2018  . IR ANGIO VERTEBRAL SEL VERTEBRAL BILAT MOD SED  04/25/2018  . IR ANGIOGRAM FOLLOW UP STUDY  04/25/2018  . IR ANGIOGRAM FOLLOW UP STUDY   04/25/2018  . IR ANGIOGRAM FOLLOW UP STUDY  04/25/2018  . IR ANGIOGRAM FOLLOW UP STUDY  04/25/2018  . IR ANGIOGRAM FOLLOW UP STUDY  04/25/2018  . IR ANGIOGRAM FOLLOW UP STUDY  04/25/2018  . IR ANGIOGRAM FOLLOW UP STUDY  04/25/2018  . IR ANGIOGRAM FOLLOW UP STUDY  04/25/2018  . IR ANGIOGRAM FOLLOW UP STUDY  04/25/2018  . IR ANGIOGRAM FOLLOW UP STUDY  04/25/2018  . IR ANGIOGRAM FOLLOW UP STUDY  04/25/2018  . IR ANGIOGRAM FOLLOW UP STUDY  04/25/2018  . IR ANGIOGRAM FOLLOW UP STUDY  04/25/2018  . IR ANGIOGRAM FOLLOW UP STUDY  04/25/2018  . IR ANGIOGRAM FOLLOW UP STUDY  04/25/2018  . IR ANGIOGRAM FOLLOW UP STUDY  04/25/2018  . IR ANGIOGRAM FOLLOW UP STUDY  04/25/2018  . IR ANGIOGRAM FOLLOW UP STUDY  04/25/2018  . IR TRANSCATH/EMBOLIZ  04/25/2018  . RADIOLOGY WITH ANESTHESIA N/A 04/25/2018   Procedure: IR WITH ANESTHESIA;  Surgeon: Radiologist, Medication, MD;  Location: [facility] OR;  Service: Radiology;  Laterality: N/A;    Allergies: Patient has no known allergies.  Medications: Prior to Admission medications   Medication Sig Start Date End Date Taking? Authorizing Provider  aspirin 81 MG chewable tablet Chew 1 tablet (81 mg total) by mouth daily. Patient not taking: Reported on 04/25/2018 04/23/16   McKeag, Janine Ores, MD  butalbital-acetaminophen-caffeine (FIORICET, [facility]) [phone] MG tablet Take 1-2 tablets by mouth every 4 (four) hours as needed for headache. 05/05/18   Julio Sicks, MD  metoprolol succinate (TOPROL-XL) 25 MG 24 hr tablet Take 1 tablet (25 mg total) by mouth daily. Patient not taking: Reported on 04/25/2018 04/14/17   Berton Bon, MD  niMODipine (NIMOTOP) 30 MG capsule Take 2  capsules (60 mg total) by mouth every 4 (four) hours for 10 days. 05/05/18 05/15/18  Julio SicksPool, Henry, MD     Family History  Problem Relation Age of Onset  . Cancer Father        Spleen cancer on chemo    Social History   Socioeconomic History  . Marital status: Married    Spouse name: Not on file  . Number  of children: Not on file  . Years of education: Not on file  . Highest education level: Not on file  Occupational History  . Not on file  Social Needs  . Financial resource strain: Not on file  . Food insecurity:    Worry: Not on file    Inability: Not on file  . Transportation needs:    Medical: Not on file    Non-medical: Not on file  Tobacco Use  . Smoking status: Current Every Day Smoker    Packs/day: 0.00    Types: Cigarettes  . Smokeless tobacco: Never Used  Substance and Sexual Activity  . Alcohol use: Yes  . Drug use: No  . Sexual activity: Not on file  Lifestyle  . Physical activity:    Days per week: Not on file    Minutes per session: Not on file  . Stress: Not on file  Relationships  . Social connections:    Talks on phone: Not on file    Gets together: Not on file    Attends religious service: Not on file    Active member of club or organization: Not on file    Attends meetings of clubs or organizations: Not on file    Relationship status: Not on file  Other Topics Concern  . Not on file  Social History Narrative  . Not on file     Review of Systems: A 12 point ROS discussed and pertinent positives are indicated in the HPI above.  All other systems are negative.  Review of Systems  Constitutional: Negative for chills and fever.  HENT: Negative for hearing loss and tinnitus.   Eyes: Negative for visual disturbance.  Respiratory: Negative for shortness of breath and wheezing.   Cardiovascular: Negative for chest pain and palpitations.  Neurological: Positive for headaches. Negative for dizziness, speech difficulty, weakness and numbness.  Psychiatric/Behavioral: Negative for behavioral problems and confusion.    Vital Signs: There were no vitals taken for this visit.  Physical Exam Constitutional:      General: He is not in acute distress.    Appearance: Normal appearance.  Pulmonary:     Effort: Pulmonary effort is normal. No respiratory  distress.  Skin:    General: Skin is warm and dry.  Neurological:     Mental Status: He is alert and oriented to person, place, and time.  Psychiatric:        Mood and Affect: Mood normal.        Behavior: Behavior normal.        Thought Content: Thought content normal.        Judgment: Judgment normal.      Imaging: Vas [place] Transcranial Doppler  Result Date: 05/04/2018  Transcranial Doppler Indications: Subarachnoid hemorrhage. Performing Technologist: Gertie FeyMichelle Simonetti MHA, RDMS, RVT, RDCS  Examination Guidelines: A complete evaluation includes B-mode imaging, spectral Doppler, color Doppler, and power Doppler as needed of all accessible portions of each vessel. Bilateral testing is considered an integral part of a complete examination. Limited examinations for reoccurring indications may be performed as noted.  +----------+-------------+----------+-----------+------------------+  RIGHT TCD Right VM (cm)Depth (cm)Pulsatility     Comment       +----------+-------------+----------+-----------+------------------+ MCA           63.00                 0.98                       +----------+-------------+----------+-----------+------------------+ ACA          -12.00                 1.34                       +----------+-------------+----------+-----------+------------------+ Term ICA      44.00                 0.88                       +----------+-------------+----------+-----------+------------------+ PCA                                         Unable to insonate +----------+-------------+----------+-----------+------------------+ Opthalmic     12.00                 1.34                       +----------+-------------+----------+-----------+------------------+ ICA siphon                                  Unable to insonate +----------+-------------+----------+-----------+------------------+ Vertebral    -24.00                 0.92                        +----------+-------------+----------+-----------+------------------+ Distal ICA   -14.00                                            +----------+-------------+----------+-----------+------------------+  +----------+------------+----------+-----------+------------------+ LEFT TCD  Left VM (cm)Depth (cm)Pulsatility     Comment       +----------+------------+----------+-----------+------------------+ MCA          71.00                 0.98                       +----------+------------+----------+-----------+------------------+ ACA          -36.00                0.94                       +----------+------------+----------+-----------+------------------+ Term ICA     -26.00                0.97                       +----------+------------+----------+-----------+------------------+ PCA  Unable to insonate +----------+------------+----------+-----------+------------------+ Opthalmic    12.00                 1.18                       +----------+------------+----------+-----------+------------------+ ICA siphon   51.00                 0.55                       +----------+------------+----------+-----------+------------------+ Vertebral                                  Unable to insonate +----------+------------+----------+-----------+------------------+ Distal ICA   -21.00                0.93                       +----------+------------+----------+-----------+------------------+  +------------+-------+------------------+             VM cm/s     Comment       +------------+-------+------------------+ Prox Basilar       Unable to insonate +------------+-------+------------------+ +----------------------+---+ Right Lindegaard Ratio4.5 +----------------------+---+ +---------------------+----+ Left Lindegaard Ratio3.38 +---------------------+----+  Summary:  Poor occipital window limits evaluation of posterior  circulation. Normal mean flow velocities in majority of identified vessels of anterior and posterior cerebral circulation. No definite evidence of vasospasm noted *See table(s) above for measurements and observations.  Diagnosing physician: Delia Heady MD Electronically signed by Delia Heady MD on 05/04/2018 at 1:05:11 PM.    Final    Vas [place] Transcranial Doppler  Result Date: 05/02/2018  Transcranial Doppler Indications: Subarachnoid hemorrhage. Performing Technologist: STURDIVANT, RITA, D  Examination Guidelines: A complete evaluation includes B-mode imaging, spectral Doppler, color Doppler, and power Doppler as needed of all accessible portions of each vessel. Bilateral testing is considered an integral part of a complete examination. Limited examinations for reoccurring indications may be performed as noted.  +----------+-------------+----------+-----------+-------+ RIGHT TCD Right VM (cm)Depth (cm)PulsatilityComment +----------+-------------+----------+-----------+-------+ MCA           61.00                 0.76            +----------+-------------+----------+-----------+-------+ PCA           16.00                 1.07            +----------+-------------+----------+-----------+-------+ Opthalmic     14.00                 1.02            +----------+-------------+----------+-----------+-------+ Distal ICA    17.00                 1.27            +----------+-------------+----------+-----------+-------+  +----------+------------+----------+-----------+-------+ LEFT TCD  Left VM (cm)Depth (cm)PulsatilityComment +----------+------------+----------+-----------+-------+ MCA          76.00                 1.03            +----------+------------+----------+-----------+-------+ PCA          48.00                 0.80            +----------+------------+----------+-----------+-------+  ICA siphon   47.00                 0.98             +----------+------------+----------+-----------+-------+ Distal ICA   19.00                 1.58            +----------+------------+----------+-----------+-------+  Summary:  Suboptimal study due to absent occipital windows and poor transtemporal windows. Normal mean flow velocities in majority of identified vessels of anterior circulation. No definite evidence of vasospasm noted in the vessels studied. *See table(s) above for measurements and observations.  Diagnosing physician: Delia HeadyPramod Sethi MD Electronically signed by Delia HeadyPramod Sethi MD on 05/02/2018 at 3:22:51 PM.    Final     Labs:  CBC: Recent Labs    04/25/18 0723 04/25/18 0730 04/26/18 0709  WBC 4.3  --  10.1  HGB 16.1 17.7* 16.3  HCT 50.8 52.0 50.2  PLT 176  --  160    COAGS: Recent Labs    04/25/18 0723  INR 1.08  APTT 28    BMP: Recent Labs    04/25/18 0723 04/25/18 0730 04/26/18 0709 04/29/18 0340  NA 138 139 136 137  K 3.4* 3.4* 3.3* 3.5  CL 100 105 99 106  CO2 22  --  26 22  GLUCOSE 109* 107* 125* 107*  BUN 7 7 <5* 6  CALCIUM 8.8*  --  8.9 8.8*  CREATININE 0.80 0.80 0.69 0.78  GFRNONAA >60  --  >60 >60  GFRAA >60  --  >60 >60    LIVER FUNCTION TESTS: No results for input(s): BILITOT, AST, ALT, ALKPHOS, PROT, ALBUMIN in the last 8760 hours.  TUMOR MARKERS: No results for input(s): AFPTM, CEA, CA199, CHROMGRNA in the last 8760 hours.  Assessment and Plan:  Subarachnoid hemorrhage secondary to ruptured left ICA posterior wall aneurysm s/p embolization using primary coiling 04/25/2018 by Dr. Corliss Skainseveshwar. Reviewed imaging with patient and wife. Brought to their attention was patient's left ICA posterior wall aneurysm, both prior to and following endovascular intervention. Explained that the best course of management at this time would be routine imaging scans. Explained that we are looking at the aneurysm neck remnant on these scans. Explained that if the remnant is still present at next imaging follow-up,  we would then consider staged endovascular intervention with a pipeline stent. If there is no neck remnant, then follow-up would be continued routine imaging scans to monitor for changes.  Discussed smoking use. Instructed patient absolutely no smoking of any type. Explained the importance of smoking cessation, including how smoking can lead to the development of another intracranial aneurysm (with history of ruptured aneurysm), delay healing from procedure 04/25/2018, and promote strokes due to vasoconstriction.  Discussed alcohol use. Instructed patient no alcohol consumption at this time.  Discussed neurosurgery follow-up. Patient states that he is seeing Dr. Jordan LikesPool on Thursday 06/02/2018. Instructed patient to follow-up with Dr. Jordan LikesPool regularly as directed.  Plan for follow-up MRI/MRA brain/head (without contrast) 4 months from 04/25/2018. Informed patient that our schedulers will call him to set up this imaging scan. Instructed patient to continue taking Aspirin 81 mg once daily.  All questions answered and concerns addressed. Patient and wife convey understanding and agree with plan.  Thank you for this interesting consult.  I greatly enjoyed meeting Clarence Larsen and look forward to participating in their care.  A copy of this report was sent to the requesting  provider on this date.  Electronically Signed: Elwin Mocha, PA-C 05/31/2018, 12:58 PM   I spent a total of 40 Minutes in face to face in clinical consultation, greater than 50% of which was counseling/coordinating care for subarachnoid hemorrhage secondary to ruptured left ICA posterior wall aneurysm s/p embolization.

## 2018-10-10 ENCOUNTER — Other Ambulatory Visit (HOSPITAL_COMMUNITY): Payer: Self-pay | Admitting: Interventional Radiology

## 2018-10-10 DIAGNOSIS — I609 Nontraumatic subarachnoid hemorrhage, unspecified: Secondary | ICD-10-CM

## 2018-10-10 DIAGNOSIS — I729 Aneurysm of unspecified site: Secondary | ICD-10-CM

## 2018-10-12 ENCOUNTER — Ambulatory Visit (HOSPITAL_COMMUNITY)
Admission: RE | Admit: 2018-10-12 | Discharge: 2018-10-12 | Disposition: A | Discharge status: Home / Self Care | Payer: 59 | Source: Ambulatory Visit | Attending: Interventional Radiology | Admitting: Interventional Radiology

## 2018-10-12 ENCOUNTER — Other Ambulatory Visit: Payer: Self-pay

## 2018-10-12 DIAGNOSIS — I729 Aneurysm of unspecified site: Secondary | ICD-10-CM | POA: Diagnosis present

## 2018-10-12 DIAGNOSIS — I609 Nontraumatic subarachnoid hemorrhage, unspecified: Secondary | ICD-10-CM | POA: Insufficient documentation

## 2018-10-19 ENCOUNTER — Other Ambulatory Visit (HOSPITAL_COMMUNITY): Payer: Self-pay | Admitting: Interventional Radiology

## 2018-10-19 DIAGNOSIS — I729 Aneurysm of unspecified site: Secondary | ICD-10-CM

## 2018-10-19 DIAGNOSIS — I609 Nontraumatic subarachnoid hemorrhage, unspecified: Secondary | ICD-10-CM

## 2018-11-09 ENCOUNTER — Other Ambulatory Visit: Payer: Self-pay

## 2018-11-09 ENCOUNTER — Ambulatory Visit (HOSPITAL_COMMUNITY)
Admission: RE | Admit: 2018-11-09 | Discharge: 2018-11-09 | Disposition: A | Discharge status: Home / Self Care | Payer: 59 | Source: Ambulatory Visit | Attending: Interventional Radiology | Admitting: Interventional Radiology

## 2018-11-09 DIAGNOSIS — I729 Aneurysm of unspecified site: Secondary | ICD-10-CM

## 2018-11-09 DIAGNOSIS — I609 Nontraumatic subarachnoid hemorrhage, unspecified: Secondary | ICD-10-CM

## 2018-11-09 NOTE — Progress Notes (Signed)
Chief Complaint: Patient was seen in consultation today for subarachnoid hemorrhage secondary to ruptured left ICA posterior wall aneurysm s/p embolization.  Referring Physician(s): Pool, Anne Ng  Supervising Physician: Julieanne Cotton  Patient Status: [facility] - In-pt  History of Present Illness: Clarence Larsen is a 43 y.o. male with a past medical history as below, with pertinent past medical history including high cholesterol, atrial fibrillation, subarachnoid hemorrhage (secondary to ruptured intracranial aneurysm) 04/2018, and prior tobacco abuse. He is known to [facility] and has been followed by Dr. Corliss Skains since 04/2018. He first presented to our department at the request of Dr. Jordan Likes for management of subarachnoid hemorrhage secondary to a ruptured intracranial aneurysm. He underwent an image-guided cerebral arteriogram with embolization of his left ICA posterior wall aneurysm using primary coiling 04/25/2018 by Dr. Corliss Skains. Patient was discharged home 05/05/2018 in stable condition. He then saw Dr. Corliss Skains in clinic 05/31/2018 for post-procedure follow-up. At that time, it was determined that the best course of management for patient's left ICA posterior wall aneurysm is with routine imaging scans to monitor for changes.  MRI/MRA brain/head 10/12/2018: 1. Normal brain MRI for age. Previously seen subarachnoid hemorrhage has resolved. No acute intracranial abnormality. 2. Mild to moderate paranasal sinus disease, likely inflammatory/allergic in nature. 3. Sequelae of prior coil embolization of large left posterior communicating artery aneurysm with tiny neck remnant as above. 4. Otherwise normal intracranial MRA.  Patient presents today for follow-up to discuss results of his recent MRI/MRA brain/head 10/12/2018. Patient awake and alert sitting in chair with no complaints at this time. Accompanied by wife. Denies headache, weakness, numbness/tingling, dizziness, vision changes,  hearing changes, tinnitus, or speech difficulty.  Patient is currently taking Aspirin 81 mg once daily.   No past medical history on file.  Past Surgical History:  Procedure Laterality Date   IR ANGIO INTRA EXTRACRAN SEL COM CAROTID INNOMINATE UNI R MOD SED  04/25/2018   IR ANGIO INTRA EXTRACRAN SEL INTERNAL CAROTID UNI L MOD SED  04/25/2018   IR ANGIO VERTEBRAL SEL VERTEBRAL BILAT MOD SED  04/25/2018   IR ANGIOGRAM FOLLOW UP STUDY  04/25/2018   IR ANGIOGRAM FOLLOW UP STUDY  04/25/2018   IR ANGIOGRAM FOLLOW UP STUDY  04/25/2018   IR ANGIOGRAM FOLLOW UP STUDY  04/25/2018   IR ANGIOGRAM FOLLOW UP STUDY  04/25/2018   IR ANGIOGRAM FOLLOW UP STUDY  04/25/2018   IR ANGIOGRAM FOLLOW UP STUDY  04/25/2018   IR ANGIOGRAM FOLLOW UP STUDY  04/25/2018   IR ANGIOGRAM FOLLOW UP STUDY  04/25/2018   IR ANGIOGRAM FOLLOW UP STUDY  04/25/2018   IR ANGIOGRAM FOLLOW UP STUDY  04/25/2018   IR ANGIOGRAM FOLLOW UP STUDY  04/25/2018   IR ANGIOGRAM FOLLOW UP STUDY  04/25/2018   IR ANGIOGRAM FOLLOW UP STUDY  04/25/2018   IR ANGIOGRAM FOLLOW UP STUDY  04/25/2018   IR ANGIOGRAM FOLLOW UP STUDY  04/25/2018   IR ANGIOGRAM FOLLOW UP STUDY  04/25/2018   IR ANGIOGRAM FOLLOW UP STUDY  04/25/2018   IR TRANSCATH/EMBOLIZ  04/25/2018   RADIOLOGY WITH ANESTHESIA N/A 04/25/2018   Procedure: IR WITH ANESTHESIA;  Surgeon: Radiologist, Medication, MD;  Location: [facility] OR;  Service: Radiology;  Laterality: N/A;    Allergies: Patient has no known allergies.  Medications: Prior to Admission medications   Medication Sig Start Date End Date Taking? Authorizing Provider  aspirin 81 MG chewable tablet Chew 1 tablet (81 mg total) by mouth daily. Patient not taking: Reported on 04/25/2018 04/23/16  McKeag, Janine OresIan D, MD  butalbital-acetaminophen-caffeine (FIORICET, ESGIC) [phone] MG tablet Take 1-2 tablets by mouth every 4 (four) hours as needed for headache. 05/05/18   Julio SicksPool, Henry, MD  metoprolol succinate (TOPROL-XL) 25 MG 24 hr  tablet Take 1 tablet (25 mg total) by mouth daily. Patient not taking: Reported on 04/25/2018 04/14/17   Berton BonMikell, Asiyah Zahra, MD  niMODipine (NIMOTOP) 30 MG capsule Take 2 capsules (60 mg total) by mouth every 4 (four) hours for 10 days. 05/05/18 05/15/18  Julio SicksPool, Henry, MD     Family History  Problem Relation Age of Onset   Cancer Father        Spleen cancer on chemo    Social History   Socioeconomic History   Marital status: Married    Spouse name: Not on file   Number of children: Not on file   Years of education: Not on file   Highest education level: Not on file  Occupational History   Not on file  Social Needs   Financial resource strain: Not on file   Food insecurity    Worry: Not on file    Inability: Not on file   Transportation needs    Medical: Not on file    Non-medical: Not on file  Tobacco Use   Smoking status: Current Every Day Smoker    Packs/day: 0.00    Types: Cigarettes   Smokeless tobacco: Never Used  Substance and Sexual Activity   Alcohol use: Yes   Drug use: No   Sexual activity: Not on file  Lifestyle   Physical activity    Days per week: Not on file    Minutes per session: Not on file   Stress: Not on file  Relationships   Social connections    Talks on phone: Not on file    Gets together: Not on file    Attends religious service: Not on file    Active member of club or organization: Not on file    Attends meetings of clubs or organizations: Not on file    Relationship status: Not on file  Other Topics Concern   Not on file  Social History Narrative   Not on file     Review of Systems: A 12 point ROS discussed and pertinent positives are indicated in the HPI above.  All other systems are negative.  Review of Systems  Constitutional: Negative for chills and fever.  HENT: Negative for hearing loss and tinnitus.   Eyes: Negative for visual disturbance.  Respiratory: Negative for shortness of breath and wheezing.     Cardiovascular: Negative for chest pain and palpitations.  Neurological: Negative for dizziness, speech difficulty, weakness, numbness and headaches.  Psychiatric/Behavioral: Negative for behavioral problems and confusion.    Physical Exam Constitutional:      General: He is not in acute distress.    Appearance: Normal appearance.  Pulmonary:     Effort: Pulmonary effort is normal. No respiratory distress.  Skin:    General: Skin is warm and dry.  Neurological:     Mental Status: He is alert and oriented to person, place, and time.  Psychiatric:        Mood and Affect: Mood normal.        Behavior: Behavior normal.        Thought Content: Thought content normal.        Judgment: Judgment normal.      Imaging: Mr Shirlee LatchMra Head Wo Contrast  Result Date: 10/12/2018 CLINICAL DATA:  43 year old male with history of ruptured left posterior communicating artery aneurysm, status post coil embolization. Follow-up. EXAM: MRI HEAD WITHOUT CONTRAST MRA HEAD WITHOUT CONTRAST TECHNIQUE: Multiplanar, multiecho pulse sequences of the brain and surrounding structures were obtained without intravenous contrast. Angiographic images of the head were obtained using MRA technique without contrast. COMPARISON:  Comparison made with prior CT from 04/27/2018 as well as prior arteriogram from 04/25/2018. FINDINGS: MRI HEAD FINDINGS Brain: Cerebral volume within normal limits for age. Minimal hazy T2/FLAIR hyperintensity noted within the periventricular white matter, nonspecific, but felt to be within normal limits for age. No evidence for acute or subacute infarct. Gray-white matter differentiation maintained. No encephalomalacia to suggest chronic cortical infarction. Previously seen subarachnoid hemorrhage has resolved. No acute intracranial hemorrhage. No mass lesion, midline shift or mass effect. No hydrocephalus. No extra-axial fluid collection. Pituitary gland suprasellar region normal. Midline structures intact  and normal. Vascular: Coiled left ICA aneurysm noted. Major intracranial vascular flow voids maintained. Skull and upper cervical spine: Craniocervical junction within normal limits. Upper cervical spine normal. Bone marrow signal intensity within normal limits. No scalp soft tissue abnormality. Sinuses/Orbits: Globes and orbital soft tissues within normal limits. Scattered mild-to-moderate mucosal thickening throughout the paranasal sinuses. No air-fluid level to suggest acute sinusitis. Small right mastoid effusion noted, of doubtful significance. Inner ear structures grossly normal. Other: None. MRA HEAD FINDINGS ANTERIOR CIRCULATION: Distal cervical segments of the internal carotid arteries are widely patent with symmetric antegrade flow. Distal cervical left ICA tortuous. Petrous segments widely patent. Cavernous and supraclinoid right ICA widely patent to the terminus without abnormality. Mild susceptibility artifact from previous coiled aneurysm at the posterior left ICA at the takeoff of the left posterior communicating artery. The aneurysm has been and remains largely obliterated. A tiny neck remnant remains visible (series 3, image 159). This is likely little interval changed from previous arteriogram. No appreciable interval growth within the coiled aneurysm sac. The adjacent hypoplastic left posterior communicating artery remains patent. Widely patent flow seen through the adjacent cavernous and supraclinoid left ICA to the terminus. A1 segments widely patent. Normal anterior communicating artery complex. Anterior cerebral arteries widely patent to their distal aspects. Normal M1 segments. Negative MCA bifurcations. Distal MCA branches well perfused and symmetric. POSTERIOR CIRCULATION: Vertebral arteries widely patent to the vertebrobasilar junction without stenosis. Posterior inferior cerebral arteries patent bilaterally. Basilar widely patent to its distal aspect without stenosis. Dominant left  anterior inferior cerebral artery. Superior cerebral arteries patent bilaterally. Both of the posterior cerebral arteries well perfused to their distal aspects without stenosis. Small bilateral posterior communicating arteries noted, right larger than left, both of which remain patent. No new aneurysm or other vascular abnormality. IMPRESSION: MRI HEAD IMPRESSION: 1. Normal brain MRI for age. Previously seen subarachnoid hemorrhage has resolved. No acute intracranial abnormality. 2. Mild to moderate paranasal sinus disease, likely inflammatory/allergic in nature. MRA HEAD IMPRESSION: 1. Sequelae of prior coil embolization of large left posterior communicating artery aneurysm with tiny neck remnant as above. 2. Otherwise normal intracranial MRA. Electronically Signed   By: Rise MuBenjamin  McClintock M.D.   On: 10/12/2018 19:58   Mr Brain Wo Contrast  Result Date: 10/12/2018 CLINICAL DATA:  43 year old male with history of ruptured left posterior communicating artery aneurysm, status post coil embolization. Follow-up. EXAM: MRI HEAD WITHOUT CONTRAST MRA HEAD WITHOUT CONTRAST TECHNIQUE: Multiplanar, multiecho pulse sequences of the brain and surrounding structures were obtained without intravenous contrast. Angiographic images of the head were obtained using MRA technique without contrast. COMPARISON:  Comparison  made with prior CT from 04/27/2018 as well as prior arteriogram from 04/25/2018. FINDINGS: MRI HEAD FINDINGS Brain: Cerebral volume within normal limits for age. Minimal hazy T2/FLAIR hyperintensity noted within the periventricular white matter, nonspecific, but felt to be within normal limits for age. No evidence for acute or subacute infarct. Gray-white matter differentiation maintained. No encephalomalacia to suggest chronic cortical infarction. Previously seen subarachnoid hemorrhage has resolved. No acute intracranial hemorrhage. No mass lesion, midline shift or mass effect. No hydrocephalus. No extra-axial  fluid collection. Pituitary gland suprasellar region normal. Midline structures intact and normal. Vascular: Coiled left ICA aneurysm noted. Major intracranial vascular flow voids maintained. Skull and upper cervical spine: Craniocervical junction within normal limits. Upper cervical spine normal. Bone marrow signal intensity within normal limits. No scalp soft tissue abnormality. Sinuses/Orbits: Globes and orbital soft tissues within normal limits. Scattered mild-to-moderate mucosal thickening throughout the paranasal sinuses. No air-fluid level to suggest acute sinusitis. Small right mastoid effusion noted, of doubtful significance. Inner ear structures grossly normal. Other: None. MRA HEAD FINDINGS ANTERIOR CIRCULATION: Distal cervical segments of the internal carotid arteries are widely patent with symmetric antegrade flow. Distal cervical left ICA tortuous. Petrous segments widely patent. Cavernous and supraclinoid right ICA widely patent to the terminus without abnormality. Mild susceptibility artifact from previous coiled aneurysm at the posterior left ICA at the takeoff of the left posterior communicating artery. The aneurysm has been and remains largely obliterated. A tiny neck remnant remains visible (series 3, image 159). This is likely little interval changed from previous arteriogram. No appreciable interval growth within the coiled aneurysm sac. The adjacent hypoplastic left posterior communicating artery remains patent. Widely patent flow seen through the adjacent cavernous and supraclinoid left ICA to the terminus. A1 segments widely patent. Normal anterior communicating artery complex. Anterior cerebral arteries widely patent to their distal aspects. Normal M1 segments. Negative MCA bifurcations. Distal MCA branches well perfused and symmetric. POSTERIOR CIRCULATION: Vertebral arteries widely patent to the vertebrobasilar junction without stenosis. Posterior inferior cerebral arteries patent  bilaterally. Basilar widely patent to its distal aspect without stenosis. Dominant left anterior inferior cerebral artery. Superior cerebral arteries patent bilaterally. Both of the posterior cerebral arteries well perfused to their distal aspects without stenosis. Small bilateral posterior communicating arteries noted, right larger than left, both of which remain patent. No new aneurysm or other vascular abnormality. IMPRESSION: MRI HEAD IMPRESSION: 1. Normal brain MRI for age. Previously seen subarachnoid hemorrhage has resolved. No acute intracranial abnormality. 2. Mild to moderate paranasal sinus disease, likely inflammatory/allergic in nature. MRA HEAD IMPRESSION: 1. Sequelae of prior coil embolization of large left posterior communicating artery aneurysm with tiny neck remnant as above. 2. Otherwise normal intracranial MRA. Electronically Signed   By: Jeannine Boga M.D.   On: 10/12/2018 19:58    Labs:  CBC: Recent Labs    04/25/18 0723 04/25/18 0730 04/26/18 0709  WBC 4.3  --  10.1  HGB 16.1 17.7* 16.3  HCT 50.8 52.0 50.2  PLT 176  --  160    COAGS: Recent Labs    04/25/18 0723  INR 1.08  APTT 28    BMP: Recent Labs    04/25/18 0723 04/25/18 0730 04/26/18 0709 04/29/18 0340  NA 138 139 136 137  K 3.4* 3.4* 3.3* 3.5  CL 100 105 99 106  CO2 22  --  26 22  GLUCOSE 109* 107* 125* 107*  BUN 7 7 <5* 6  CALCIUM 8.8*  --  8.9 8.8*  CREATININE 0.80 0.80  0.69 0.78  GFRNONAA >60  --  >60 >60  GFRAA >60  --  >60 >60     Assessment and Plan:  Subarachnoid hemorrhage secondary to ruptured left ICA posterior wall aneurysm s/p embolization using primary coiling 04/25/2018 by Dr. Corliss Skainseveshwar. Dr. Corliss Skainseveshwar was present for consultation. Discussed patient's symptoms post-procedure. Patient is grossly asymptomatic at this time. States that he has returned to work. Discussed results of MRI/MRA- explained that the subarachnoid hemorrhage has subsided and his aneurysm has shrunk,  but still has a neck remnant (aneurysm is not completely gone). Explained that there are two management options moving forward- either endovascular embolization or routine imaging scans to monitor for changes. Since patient is grossly asymptomatic, recommend routine imaging scans in 6 months, and base further medical management based on these results.  Discussed patient's tobacco use. Patient states that he quit smoking. Advised patient to continue not smoking indefinitely.  Discussed patient's hypertension. Patient states that he has not been checking his BP and has not seen his PCP since our procedure. Advised patient to follow-up with his PCP regularly regarding hypertension control.  Plan for follow-up with MRI/MRA brain/head (without contrast) in 6 months. Informed patient that our schedulers will call him to set up this imaging scan. Instructed patient to continue taking Aspirin 81 mg once daily.  All questions answered and concerns addressed. Patient and wife convey understanding and agree with plan.   Thank you for this interesting consult.  I greatly enjoyed meeting Clarence Larsen and look forward to participating in their care.  A copy of this report was sent to the requesting provider on this date.  Electronically Signed: Elwin MochaAlexandra Kathleena Freeman, PA-C 11/09/2018, 10:03 AM   I spent a total of 40 Minutes in face to face in clinical consultation, greater than 50% of which was counseling/coordinating care for subarachnoid hemorrhage secondary to ruptured left ICA posterior wall aneurysm s/p embolization.

## 2019-04-28 DIAGNOSIS — Z2821 Immunization not carried out because of patient refusal: Secondary | ICD-10-CM | POA: Diagnosis not present

## 2019-04-28 DIAGNOSIS — I480 Paroxysmal atrial fibrillation: Secondary | ICD-10-CM | POA: Diagnosis not present

## 2019-04-28 DIAGNOSIS — R030 Elevated blood-pressure reading, without diagnosis of hypertension: Secondary | ICD-10-CM | POA: Diagnosis not present

## 2019-04-28 DIAGNOSIS — I671 Cerebral aneurysm, nonruptured: Secondary | ICD-10-CM | POA: Diagnosis not present

## 2019-04-28 DIAGNOSIS — E7800 Pure hypercholesterolemia, unspecified: Secondary | ICD-10-CM | POA: Diagnosis not present

## 2019-04-28 DIAGNOSIS — Z8679 Personal history of other diseases of the circulatory system: Secondary | ICD-10-CM | POA: Diagnosis not present

## 2019-05-16 ENCOUNTER — Other Ambulatory Visit (HOSPITAL_COMMUNITY): Payer: Self-pay | Admitting: Interventional Radiology

## 2019-05-16 DIAGNOSIS — I639 Cerebral infarction, unspecified: Secondary | ICD-10-CM

## 2019-05-23 DIAGNOSIS — E7800 Pure hypercholesterolemia, unspecified: Secondary | ICD-10-CM | POA: Diagnosis not present

## 2019-05-23 DIAGNOSIS — R634 Abnormal weight loss: Secondary | ICD-10-CM | POA: Diagnosis not present

## 2019-05-23 DIAGNOSIS — I480 Paroxysmal atrial fibrillation: Secondary | ICD-10-CM | POA: Diagnosis not present

## 2019-05-24 DIAGNOSIS — E7800 Pure hypercholesterolemia, unspecified: Secondary | ICD-10-CM | POA: Diagnosis not present

## 2019-06-06 ENCOUNTER — Ambulatory Visit (HOSPITAL_COMMUNITY): Admission: RE | Admit: 2019-06-06 | Payer: 59 | Source: Ambulatory Visit

## 2019-06-06 ENCOUNTER — Encounter (HOSPITAL_COMMUNITY): Payer: Self-pay

## 2019-06-06 ENCOUNTER — Ambulatory Visit (HOSPITAL_COMMUNITY)
Admission: RE | Admit: 2019-06-06 | Discharge: 2019-06-06 | Disposition: A | Discharge status: Home / Self Care | Payer: 59 | Source: Ambulatory Visit | Attending: Interventional Radiology | Admitting: Interventional Radiology

## 2019-06-06 ENCOUNTER — Other Ambulatory Visit: Payer: Self-pay

## 2019-06-06 DIAGNOSIS — I639 Cerebral infarction, unspecified: Secondary | ICD-10-CM | POA: Diagnosis not present

## 2019-06-06 MED ORDER — GADOBUTROL 1 MMOL/ML IV SOLN
9.0000 mL | Freq: Once | INTRAVENOUS | Status: AC | PRN
  Administered 2019-06-06: 9 mL via INTRAVENOUS

## 2019-06-07 ENCOUNTER — Other Ambulatory Visit (HOSPITAL_COMMUNITY): Payer: Self-pay | Admitting: Interventional Radiology

## 2019-06-07 DIAGNOSIS — I609 Nontraumatic subarachnoid hemorrhage, unspecified: Secondary | ICD-10-CM

## 2019-06-07 DIAGNOSIS — I729 Aneurysm of unspecified site: Secondary | ICD-10-CM

## 2019-06-12 ENCOUNTER — Other Ambulatory Visit: Payer: Self-pay

## 2019-06-12 ENCOUNTER — Ambulatory Visit (HOSPITAL_COMMUNITY)
Admission: RE | Admit: 2019-06-12 | Discharge: 2019-06-12 | Disposition: A | Discharge status: Home / Self Care | Payer: 59 | Source: Ambulatory Visit | Attending: Interventional Radiology | Admitting: Interventional Radiology

## 2019-06-12 DIAGNOSIS — I729 Aneurysm of unspecified site: Secondary | ICD-10-CM

## 2019-06-12 DIAGNOSIS — I609 Nontraumatic subarachnoid hemorrhage, unspecified: Secondary | ICD-10-CM

## 2019-06-12 NOTE — Progress Notes (Signed)
Chief Complaint: Patient was seen in consultation today for subarachnoid hemorrhage secondary to ruptured left ICA posterior wall aneurysm s/p embolization.  Referring Physician(s): Pool, Anne Ng  Supervising Physician: Julieanne Cotton  Patient Status: [facility] - Out-pt  History of Present Illness: Clarence Larsen is a 44 y.o. male with a past medical history as below, with pertinent past medical history including high cholesterol, atrial fibrillation,subarachnoid hemorrhage (secondary to ruptured intracranial aneurysm) 04/2018,and tobacco/marijuana abuse.He is known to [facility] and has been followed by Dr. Corliss Skains since 04/2018. He first presented to our department at the request of Dr. Jordan Likes for management of subarachnoid hemorrhage secondary to a ruptured intracranial aneurysm. He underwent an image-guided cerebral arteriogram with embolization of his left ICA posterior wall aneurysm using primary coiling 04/25/2018 by Dr. Corliss Skains. He has since been followed with routine imaging scans to monitor for changes.  MRI/MRA brain/head 06/06/2019: 1. 5 mm acute ischemic nonhemorrhagic cortical infarct involving the left parietal lobe as above. 2. Otherwise stable and negative brain MRI for age. 3. Sequelae of prior coil embolization of left posterior communicating artery aneurysm with persistent neck remnants measuring up to 5 mm as above. Findings are not significantly changed as compared to previous exam. 2. Otherwise stable and normal intracranial MRA.  Patient presents today for follow-up to discuss findings of recent MRI/MRA brain/head 06/06/2019. Patient awake and alert sitting in chair with no complaints at this time. Accompanied by wife at bedside. Denies headache, weakness, numbness/tingling, dizziness, vision changes, hearing changes, tinnitus, or speech difficulty.  Currently taking Aspirin 81 mg once daily.   No past medical history on file.  Past Surgical History:    Procedure Laterality Date  . IR ANGIO INTRA EXTRACRAN SEL COM CAROTID INNOMINATE UNI R MOD SED  04/25/2018  . IR ANGIO INTRA EXTRACRAN SEL INTERNAL CAROTID UNI L MOD SED  04/25/2018  . IR ANGIO VERTEBRAL SEL VERTEBRAL BILAT MOD SED  04/25/2018  . IR ANGIOGRAM FOLLOW UP STUDY  04/25/2018  . IR ANGIOGRAM FOLLOW UP STUDY  04/25/2018  . IR ANGIOGRAM FOLLOW UP STUDY  04/25/2018  . IR ANGIOGRAM FOLLOW UP STUDY  04/25/2018  . IR ANGIOGRAM FOLLOW UP STUDY  04/25/2018  . IR ANGIOGRAM FOLLOW UP STUDY  04/25/2018  . IR ANGIOGRAM FOLLOW UP STUDY  04/25/2018  . IR ANGIOGRAM FOLLOW UP STUDY  04/25/2018  . IR ANGIOGRAM FOLLOW UP STUDY  04/25/2018  . IR ANGIOGRAM FOLLOW UP STUDY  04/25/2018  . IR ANGIOGRAM FOLLOW UP STUDY  04/25/2018  . IR ANGIOGRAM FOLLOW UP STUDY  04/25/2018  . IR ANGIOGRAM FOLLOW UP STUDY  04/25/2018  . IR ANGIOGRAM FOLLOW UP STUDY  04/25/2018  . IR ANGIOGRAM FOLLOW UP STUDY  04/25/2018  . IR ANGIOGRAM FOLLOW UP STUDY  04/25/2018  . IR ANGIOGRAM FOLLOW UP STUDY  04/25/2018  . IR ANGIOGRAM FOLLOW UP STUDY  04/25/2018  . IR TRANSCATH/EMBOLIZ  04/25/2018  . RADIOLOGY WITH ANESTHESIA N/A 04/25/2018   Procedure: IR WITH ANESTHESIA;  Surgeon: Radiologist, Medication, MD;  Location: [facility] OR;  Service: Radiology;  Laterality: N/A;    Allergies: Patient has no known allergies.  Medications: Prior to Admission medications   Medication Sig Start Date End Date Taking? Authorizing Provider  aspirin 81 MG chewable tablet Chew 1 tablet (81 mg total) by mouth daily. Patient not taking: Reported on 04/25/2018 04/23/16   McKeag, Janine Ores, MD  butalbital-acetaminophen-caffeine (FIORICET, ESGIC) [phone] MG tablet Take 1-2 tablets by mouth every 4 (four) hours as needed for headache.  05/05/18   Earnie Larsson, MD  metoprolol succinate (TOPROL-XL) 25 MG 24 hr tablet Take 1 tablet (25 mg total) by mouth daily. Patient not taking: Reported on 04/25/2018 04/14/17   Tonette Bihari, MD  niMODipine (NIMOTOP) 30 MG capsule  Take 2 capsules (60 mg total) by mouth every 4 (four) hours for 10 days. 05/05/18 05/15/18  Earnie Larsson, MD     Family History  Problem Relation Age of Onset  . Cancer Father        Spleen cancer on chemo    Social History   Socioeconomic History  . Marital status: Married    Spouse name: Not on file  . Number of children: Not on file  . Years of education: Not on file  . Highest education level: Not on file  Occupational History  . Not on file  Tobacco Use  . Smoking status: Current Every Day Smoker    Packs/day: 0.00    Types: Cigarettes  . Smokeless tobacco: Never Used  Substance and Sexual Activity  . Alcohol use: Yes  . Drug use: No  . Sexual activity: Not on file  Other Topics Concern  . Not on file  Social History Narrative  . Not on file   Social Determinants of Health   Financial Resource Strain:   . Difficulty of Paying Living Expenses: Not on file  Food Insecurity:   . Worried About Charity fundraiser in the Last Year: Not on file  . Ran Out of Food in the Last Year: Not on file  Transportation Needs:   . Lack of Transportation (Medical): Not on file  . Lack of Transportation (Non-Medical): Not on file  Physical Activity:   . Days of Exercise per Week: Not on file  . Minutes of Exercise per Session: Not on file  Stress:   . Feeling of Stress : Not on file  Social Connections:   . Frequency of Communication with Friends and Family: Not on file  . Frequency of Social Gatherings with Friends and Family: Not on file  . Attends Religious Services: Not on file  . Active Member of Clubs or Organizations: Not on file  . Attends Archivist Meetings: Not on file  . Marital Status: Not on file     Review of Systems: A 12 point ROS discussed and pertinent positives are indicated in the HPI above.  All other systems are negative.  Review of Systems  Constitutional: Negative for chills and fever.  HENT: Negative for hearing loss and tinnitus.     Respiratory: Negative for shortness of breath and wheezing.   Cardiovascular: Negative for chest pain and palpitations.  Neurological: Negative for dizziness, speech difficulty, weakness, numbness and headaches.  Psychiatric/Behavioral: Negative for behavioral problems and confusion.    Vital Signs: There were no vitals taken for this visit.  Physical Exam Constitutional:      General: He is not in acute distress.    Appearance: Normal appearance.  Pulmonary:     Effort: Pulmonary effort is normal. No respiratory distress.  Skin:    General: Skin is warm and dry.  Neurological:     Mental Status: He is alert and oriented to person, place, and time.  Psychiatric:        Mood and Affect: Mood normal.        Behavior: Behavior normal.      Imaging: MR ANGIO HEAD WO CONTRAST  Result Date: 06/06/2019 CLINICAL DATA:  44 year old male with history  of ruptured left posterior communicating artery aneurysm, status post coil embolization. Follow-up. EXAM: MRI HEAD WITHOUT AND WITH CONTRAST MRA HEAD WITHOUT CONTRAST TECHNIQUE: Multiplanar, multiecho pulse sequences of the brain and surrounding structures were obtained without and with intravenous contrast. Angiographic images of the head were obtained using MRA technique without contrast. CONTRAST:  24mL GADAVIST GADOBUTROL 1 MMOL/ML IV SOLN COMPARISON:  Comparison made with previous MRI from 10/12/2018. FINDINGS: MRI HEAD FINDINGS Brain: Cerebral volume stable, and within normal limits for age. No significant cerebral white matter disease for age. There is a 5 mm focus of restricted diffusion involving the cortical gray matter of the left parietal lobe (series 5, image 83), consistent with a small acute ischemic infarct. No associated hemorrhage or mass effect. No other evidence for acute or subacute ischemia. Gray-white matter differentiation otherwise maintained. No encephalomalacia to suggest chronic cortical infarction elsewhere. No evidence  for acute intracranial hemorrhage. No mass lesion, midline shift or mass effect. No hydrocephalus. No extra-axial fluid collection. Pituitary gland and suprasellar region normal. Midline structures intact. No abnormal enhancement. Vascular: Susceptibility artifact from coiled left ICA aneurysm noted. Major intracranial vascular flow voids maintained. Skull and upper cervical spine: Craniocervical junction within normal limits. Upper cervical spine unremarkable. Bone marrow signal intensity within normal limits. No scalp soft tissue abnormality. Sinuses/Orbits: Globes and orbital soft tissues within normal limits. Scattered mucosal thickening noted within the ethmoidal air cells, likely allergic/inflammatory nature. Paranasal sinuses are otherwise largely clear. Trace bilateral mastoid effusions, of doubtful significance. Visualized nasopharynx within normal limits. Inner ear structures grossly unremarkable. Other: None. MRA HEAD FINDINGS ANTERIOR CIRCULATION: Examination mildly degraded by motion artifact. Distal cervical segments of the internal carotid arteries are patent with symmetric antegrade flow. Distal cervical left ICA tortuous. Petrous segments widely patent. Cavernous and supraclinoid right ICA widely patent to the terminus without abnormality. Susceptibility artifact related to previously coiled aneurysm at the takeoff of the left posterior communicating artery again seen. Aneurysm remains largely obliterated. There is a persistent neck remnant along the inferior aspect of the coil pack measuring approximately 5 x 5 mm (series 9, image 95). This is also seen on MIP reconstructions (series 1033, image 5). This is relatively stable from previous. Additional tiny 1 mm neck remnant seen more superiorly at the base of the coil pack (series 9, image 111), also unchanged. No interval growth or expansion of the coil pack itself which measures 1.5 x 1.0 cm, stable (series 9, image 109). Widely patent flow seen  through the adjacent cavernous and supraclinoid left ICA to the terminus. A1 segments widely patent. Normal anterior communicating artery complex. Anterior cerebral arteries widely patent to their distal aspects. Normal M1 segments. Negative MCA bifurcations. Distal MCA branches well perfused and symmetric. POSTERIOR CIRCULATION: Vertebral arteries widely patent to the vertebrobasilar junction without stenosis. Posterior inferior cerebral arteries patent bilaterally. Basilar widely patent to its distal aspect without stenosis. Dominant left AICA. Superior cerebral arteries patent bilaterally. Both PCAs well perfused to their distal aspects without stenosis. No new aneurysm or other vascular abnormality. IMPRESSION: MRI HEAD IMPRESSION: 1. 5 mm acute ischemic nonhemorrhagic cortical infarct involving the left parietal lobe as above. 2. Otherwise stable and negative brain MRI for age. MRA HEAD IMPRESSION: 1. Sequelae of prior coil embolization of left posterior communicating artery aneurysm with persistent neck remnants measuring up to 5 mm as above. Findings are not significantly changed as compared to previous exam. 2. Otherwise stable and normal intracranial MRA. These results will be called to the ordering  clinician or representative by the Radiologist Assistant, and communication documented in the PACS or zVision Dashboard. Electronically Signed   By: Rise Mu M.D.   On: 06/06/2019 22:26   MR BRAIN W WO CONTRAST  Result Date: 06/06/2019 CLINICAL DATA:  44 year old male with history of ruptured left posterior communicating artery aneurysm, status post coil embolization. Follow-up. EXAM: MRI HEAD WITHOUT AND WITH CONTRAST MRA HEAD WITHOUT CONTRAST TECHNIQUE: Multiplanar, multiecho pulse sequences of the brain and surrounding structures were obtained without and with intravenous contrast. Angiographic images of the head were obtained using MRA technique without contrast. CONTRAST:  52mL GADAVIST  GADOBUTROL 1 MMOL/ML IV SOLN COMPARISON:  Comparison made with previous MRI from 10/12/2018. FINDINGS: MRI HEAD FINDINGS Brain: Cerebral volume stable, and within normal limits for age. No significant cerebral white matter disease for age. There is a 5 mm focus of restricted diffusion involving the cortical gray matter of the left parietal lobe (series 5, image 83), consistent with a small acute ischemic infarct. No associated hemorrhage or mass effect. No other evidence for acute or subacute ischemia. Gray-white matter differentiation otherwise maintained. No encephalomalacia to suggest chronic cortical infarction elsewhere. No evidence for acute intracranial hemorrhage. No mass lesion, midline shift or mass effect. No hydrocephalus. No extra-axial fluid collection. Pituitary gland and suprasellar region normal. Midline structures intact. No abnormal enhancement. Vascular: Susceptibility artifact from coiled left ICA aneurysm noted. Major intracranial vascular flow voids maintained. Skull and upper cervical spine: Craniocervical junction within normal limits. Upper cervical spine unremarkable. Bone marrow signal intensity within normal limits. No scalp soft tissue abnormality. Sinuses/Orbits: Globes and orbital soft tissues within normal limits. Scattered mucosal thickening noted within the ethmoidal air cells, likely allergic/inflammatory nature. Paranasal sinuses are otherwise largely clear. Trace bilateral mastoid effusions, of doubtful significance. Visualized nasopharynx within normal limits. Inner ear structures grossly unremarkable. Other: None. MRA HEAD FINDINGS ANTERIOR CIRCULATION: Examination mildly degraded by motion artifact. Distal cervical segments of the internal carotid arteries are patent with symmetric antegrade flow. Distal cervical left ICA tortuous. Petrous segments widely patent. Cavernous and supraclinoid right ICA widely patent to the terminus without abnormality. Susceptibility artifact  related to previously coiled aneurysm at the takeoff of the left posterior communicating artery again seen. Aneurysm remains largely obliterated. There is a persistent neck remnant along the inferior aspect of the coil pack measuring approximately 5 x 5 mm (series 9, image 95). This is also seen on MIP reconstructions (series 1033, image 5). This is relatively stable from previous. Additional tiny 1 mm neck remnant seen more superiorly at the base of the coil pack (series 9, image 111), also unchanged. No interval growth or expansion of the coil pack itself which measures 1.5 x 1.0 cm, stable (series 9, image 109). Widely patent flow seen through the adjacent cavernous and supraclinoid left ICA to the terminus. A1 segments widely patent. Normal anterior communicating artery complex. Anterior cerebral arteries widely patent to their distal aspects. Normal M1 segments. Negative MCA bifurcations. Distal MCA branches well perfused and symmetric. POSTERIOR CIRCULATION: Vertebral arteries widely patent to the vertebrobasilar junction without stenosis. Posterior inferior cerebral arteries patent bilaterally. Basilar widely patent to its distal aspect without stenosis. Dominant left AICA. Superior cerebral arteries patent bilaterally. Both PCAs well perfused to their distal aspects without stenosis. No new aneurysm or other vascular abnormality. IMPRESSION: MRI HEAD IMPRESSION: 1. 5 mm acute ischemic nonhemorrhagic cortical infarct involving the left parietal lobe as above. 2. Otherwise stable and negative brain MRI for age. MRA HEAD IMPRESSION:  1. Sequelae of prior coil embolization of left posterior communicating artery aneurysm with persistent neck remnants measuring up to 5 mm as above. Findings are not significantly changed as compared to previous exam. 2. Otherwise stable and normal intracranial MRA. These results will be called to the ordering clinician or representative by the Radiologist Assistant, and  communication documented in the PACS or zVision Dashboard. Electronically Signed   By: Rise MuBenjamin  McClintock M.D.   On: 06/06/2019 22:26    Labs:  CBC: No results for input(s): WBC, HGB, HCT, PLT in the last 8760 hours.  COAGS: No results for input(s): INR, APTT in the last 8760 hours.  BMP: No results for input(s): NA, K, CL, CO2, GLUCOSE, BUN, CALCIUM, CREATININE, GFRNONAA, GFRAA in the last 8760 hours.  Invalid input(s): CMP  LIVER FUNCTION TESTS: No results for input(s): BILITOT, AST, ALT, ALKPHOS, PROT, ALBUMIN in the last 8760 hours.  TUMOR MARKERS: No results for input(s): AFPTM, CEA, CA199, CHROMGRNA in the last 8760 hours.  Assessment and Plan:  History of subarachnoid hemorrhage secondary to ruptured left ICA posterior wall aneurysm s/p embolization using coiling 04/25/2018 by Dr. Corliss Skainseveshwar. Dr. Corliss Skainseveshwar was present for consultation. Discussed symptoms. Patient is grossly asymptomatic at this time. Discussed MRI/MRA findings and explained that his aneurysm is stable, however there is evidence of a persistent neck remnant. Explained that there are two management options moving forward- either continued conservative management including routine imaging scans to monitor for changes or with an endovascular embolization procedure. Explained procedure, including risks and benefits. Patient expresses desire to move forward with conservative management at this time.  Discussed hypertension. Advised patient to follow-up with his PCP regularly regarding hypertension management.  Discussed tobacco/marijuana abuse. Counseled patient on tobacco/marijuana cessation.  Plan for follow-up with MRI/MRA brain/head (without contrast) in 6 months. Informed patient that our schedulers will call him to set up this imaging scan. Instructed patient to continue taking Aspirin 81 mg once daily.  All questions answered and concerns addressed. Patient conveys understanding and agrees with  plan.  Thank you for this interesting consult.  I greatly enjoyed meeting Clarence Larsen and look forward to participating in their care.  A copy of this report was sent to the requesting provider on this date.  Electronically Signed: Elwin MochaAlexandra Samayra Hebel, PA-C 06/12/2019, 9:16 AM   I spent a total of 40 Minutes in face to face in clinical consultation, greater than 50% of which was counseling/coordinating care for subarachnoid hemorrhage secondary to ruptured left ICA posterior wall aneurysm s/p embolization.

## 2019-06-28 DIAGNOSIS — I4891 Unspecified atrial fibrillation: Secondary | ICD-10-CM | POA: Diagnosis not present

## 2019-06-28 DIAGNOSIS — R69 Illness, unspecified: Secondary | ICD-10-CM | POA: Diagnosis not present

## 2019-06-28 DIAGNOSIS — Z0000 Encounter for general adult medical examination without abnormal findings: Secondary | ICD-10-CM | POA: Diagnosis not present

## 2019-06-28 DIAGNOSIS — Z8679 Personal history of other diseases of the circulatory system: Secondary | ICD-10-CM | POA: Diagnosis not present

## 2019-06-29 DIAGNOSIS — R002 Palpitations: Secondary | ICD-10-CM | POA: Diagnosis not present

## 2019-11-30 ENCOUNTER — Other Ambulatory Visit (HOSPITAL_COMMUNITY): Payer: Self-pay | Admitting: Interventional Radiology

## 2019-11-30 DIAGNOSIS — I639 Cerebral infarction, unspecified: Secondary | ICD-10-CM

## 2019-11-30 DIAGNOSIS — I671 Cerebral aneurysm, nonruptured: Secondary | ICD-10-CM

## 2019-11-30 DIAGNOSIS — I609 Nontraumatic subarachnoid hemorrhage, unspecified: Secondary | ICD-10-CM

## 2019-12-18 ENCOUNTER — Ambulatory Visit: Payer: No Typology Code available for payment source | Attending: Internal Medicine

## 2019-12-18 ENCOUNTER — Other Ambulatory Visit: Payer: No Typology Code available for payment source

## 2019-12-18 DIAGNOSIS — Z20822 Contact with and (suspected) exposure to covid-19: Secondary | ICD-10-CM

## 2019-12-19 LAB — NOVEL CORONAVIRUS, NAA: SARS-CoV-2, NAA: NOT DETECTED

## 2019-12-19 LAB — SARS-COV-2, NAA 2 DAY TAT

## 2019-12-29 ENCOUNTER — Other Ambulatory Visit (HOSPITAL_COMMUNITY): Payer: Self-pay | Admitting: Interventional Radiology

## 2019-12-29 DIAGNOSIS — I609 Nontraumatic subarachnoid hemorrhage, unspecified: Secondary | ICD-10-CM

## 2020-01-01 ENCOUNTER — Ambulatory Visit (HOSPITAL_COMMUNITY)
Admission: RE | Admit: 2020-01-01 | Discharge: 2020-01-01 | Disposition: A | Discharge status: Home / Self Care | Payer: No Typology Code available for payment source | Source: Ambulatory Visit | Attending: Interventional Radiology | Admitting: Interventional Radiology

## 2020-01-01 ENCOUNTER — Other Ambulatory Visit: Payer: Self-pay

## 2020-01-01 DIAGNOSIS — I609 Nontraumatic subarachnoid hemorrhage, unspecified: Secondary | ICD-10-CM

## 2020-01-01 NOTE — Consult Note (Signed)
Chief Complaint: Patient was seen in consultation today for follow-up.[name]   Referring Physician(s): * No referring provider recorded for this case *  Supervising PhysicianHistory of Present Illness:   Clarence Larsen is a 44 y.o. 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  Patient made an uneventful recovery and was discharged.  His most recent follow-up was on 06/12/2019 following an MRI MRA of the brain.  This revealed a very small diffusion-weighted abnormality in the left parietal cortical region.  The MRA revealed an approximate 5 mm residual remnant of the previously called left posterior communicating artery region aneurysm.  It was decided at the time to continue with conservative surveillance in regards the neck remnant of the treated aneurysm.  Patient returns today accompanied by his wife.  She reports the patient had at least 3 generalized seizures on their way back from [place] few days ago.  Patient was evaluated at a hospital in [place] where according to the patient and the spouse the patient was treated with IV anticonvulsants.  They do not have any accompanying notes or discharge summaries.  They cannot recollect the names of the medicine prescribed at the time.  According to the patient he did have what appears to be a CT of the brain which did not show any intracranial hemorrhage.  The patient apparently has not had any seizures since Friday.  The seizures were witnessed by the spouse.  She described the patient rolling his eyes with a blank stare and generalized shaking of the upper extremities on all 3 occasions.  Seizures lasted about 2 to 3 minutes.  During 1 of these the patient did bite inside of his cheek with resultant mild bleeding.    At the present time the patient reports just generalized  weakness though it is getting better.  Cognitively, the patient is near his baseline.  Presently he denies any nausea vomiting headaches visual speech motor sensory or coordination difficulties.  According to the spouse the patient has been  drinking during their vacation more so than usually.  This involved both beers and hard liquor.  According to the spouse he had been drinking more than usual 5-6 beers a day with hard liquor during the weekends.  Patient also reports reverting to smoking cigars, and marijuana on a daily basis for the past at least 5 months.   Past Surgical History:  Procedure Laterality Date  . IR ANGIO INTRA EXTRACRAN SEL COM CAROTID INNOMINATE UNI R MOD SED  04/25/2018  . IR ANGIO INTRA EXTRACRAN SEL INTERNAL CAROTID UNI L MOD SED  04/25/2018  . IR ANGIO VERTEBRAL SEL VERTEBRAL BILAT MOD SED  04/25/2018  . IR ANGIOGRAM FOLLOW UP STUDY  04/25/2018  . IR ANGIOGRAM FOLLOW UP STUDY  04/25/2018  . IR ANGIOGRAM FOLLOW UP STUDY  04/25/2018  . IR ANGIOGRAM FOLLOW UP STUDY  04/25/2018  . IR ANGIOGRAM FOLLOW UP STUDY  04/25/2018  . IR ANGIOGRAM FOLLOW UP STUDY  04/25/2018  . IR ANGIOGRAM FOLLOW UP STUDY  04/25/2018  . IR ANGIOGRAM FOLLOW UP STUDY  04/25/2018  . IR ANGIOGRAM FOLLOW UP STUDY  04/25/2018  . IR ANGIOGRAM FOLLOW UP STUDY  04/25/2018  . IR ANGIOGRAM FOLLOW UP STUDY  04/25/2018  . IR ANGIOGRAM FOLLOW UP STUDY  04/25/2018  . IR ANGIOGRAM FOLLOW UP STUDY  04/25/2018  . IR ANGIOGRAM FOLLOW UP  STUDY  04/25/2018  . IR ANGIOGRAM FOLLOW UP STUDY  04/25/2018  . IR ANGIOGRAM FOLLOW UP STUDY  04/25/2018  . IR ANGIOGRAM FOLLOW UP STUDY  04/25/2018  . IR ANGIOGRAM FOLLOW UP STUDY  04/25/2018  . IR TRANSCATH/EMBOLIZ  04/25/2018  . RADIOLOGY WITH ANESTHESIA N/A 04/25/2018   Procedure: IR WITH ANESTHESIA;  Surgeon: Radiologist, Medication, MD;  Location: [facility] OR;  Service: Radiology;  Laterality: N/A;    Allergies: Patient has no known allergies.  Medications: Prior to Admission medications    Medication Sig Start Date End Date Taking? Authorizing Provider  aspirin 81 MG chewable tablet Chew 1 tablet (81 mg total) by mouth daily. Patient not taking: Reported on 04/25/2018 04/23/16   McKeag, Janine Ores, MD  butalbital-acetaminophen-caffeine (FIORICET, [facility]) [phone] MG tablet Take 1-2 tablets by mouth every 4 (four) hours as needed for headache. 05/05/18   Julio Sicks, MD  metoprolol succinate (TOPROL-XL) 25 MG 24 hr tablet Take 1 tablet (25 mg total) by mouth daily. Patient not taking: Reported on 04/25/2018 04/14/17   Berton Bon, MD  niMODipine (NIMOTOP) 30 MG capsule Take 2 capsules (60 mg total) by mouth every 4 (four) hours for 10 days. 05/05/18 05/15/18  Julio Sicks, MD     Family History  Problem Relation Age of Onset  . Cancer Father        Spleen cancer on chemo    Social History   Socioeconomic History  . Marital status: Married    Spouse name: Not on file  . Number of children: Not on file  . Years of education: Not on file  . Highest education level: Not on file  Occupational History  . Not on file  Tobacco Use  . Smoking status: Current Every Day Smoker    Packs/day: 0.00    Types: Cigarettes  . Smokeless tobacco: Never Used  Substance and Sexual Activity  . Alcohol use: Yes  . Drug use: No  . Sexual activity: Not on file  Other Topics Concern  . Not on file  Social History Narrative  . Not on file   Social Determinants of Health   Financial Resource Strain:   . Difficulty of Paying Living Expenses:   Food Insecurity:   . Worried About Programme researcher, broadcasting/film/video in the Last Year:   . Barista in the Last Year:   Transportation Needs:   . Freight forwarder (Medical):   [name] Lack of Transportation (Non-Medical):   Physical Activity:   . Days of Exercise per Week:   . Minutes of Exercise per Session:   Stress:   . Feeling of Stress :   Social Connections:   . Frequency of Communication with Friends and Family:   . Frequency of  Social Gatherings with Friends and Family:   . Attends Religious Services:   . Active Member of Clubs or Organizations:   . Attends Banker Meetings:   [name] Marital Status:      Review of Systems: A 12 point ROS discussed and pertinent positives are indicated in the HPI above.  All other systems are negative.  Review of Systems  Negative for pathologic symptomatology pertaining to systems.  Vital Signs: BP 111/73 (BP Location: Right Arm)   Pulse 66   Temp 98.8 F (37.1 C) (Oral)   Resp 20   Ht 5\' 11"  (1.803 m)   Wt 102.7 kg   SpO2 97%   BMI 31.58 kg/m   Physical Exam  Grossly neurologically intact.  No lateralizing neurological features.   Imaging: No results found.  Labs:  CBC: No results for input(s): WBC, HGB, HCT, PLT in the last 8760 hours.  COAGS: No results for input(s): INR, APTT in the last 8760 hours.  BMP: No results for input(s): NA, K, CL, CO2, GLUCOSE, BUN, CALCIUM, CREATININE, GFRNONAA, GFRAA in the last 8760 hours.  Invalid input(s): CMP  LIVER FUNCTION TESTS: No results for input(s): BILITOT, AST, ALT, ALKPHOS, PROT, ALBUMIN in the last 8760 hours.  TUMOR MARKERS: No results for input(s): AFPTM, CEA, CA199, CHROMGRNA in the last 8760 hours.  Assessment and Plan:  History as above suggestive more of alcohol withdrawal seizures.  However could be related to a structural etiology given the MRI findings.  The patient has been advised strongly to refrain from further alcohol intake and keep his appointment with his primary care in regards preventive measures.  Patient continues to smoke marijuana which he has for the past 5 months.  He has been informed of the increased risk of neurological injury related to the intake marijuana.  Informed patient that our schedulers will call to arrange for MRI of the brain and MRA of the brain.  The patient and spouse were advised to have his primary physician refer him to a neurologist for work-up  for new onset seizures.  .  They were also informed of the likelihood of her retreatment of the recanalized left posterior communicating artery aneurysm. All questions answered and concerns addressed. Patient conveys understanding and agrees with plan.  Thank you for this interesting consult.  I greatly enjoyed meeting Galan Ghee and look forward to participating in their care.  A copy of this report was sent to the requesting provider on this date.  Electronically Signed: Oneal Grout, MD 01/01/2020, 10:40 AM   I spent a total of 30-minute in face to face in clinical consultation, greater than 50% of which was counseling/coordinating care for alcohol-withdrawal seizures, and management of partially recanalized previously treated intradural aneurysms.

## 2020-01-04 ENCOUNTER — Other Ambulatory Visit: Payer: Self-pay

## 2020-01-04 ENCOUNTER — Ambulatory Visit (HOSPITAL_COMMUNITY)
Admission: RE | Admit: 2020-01-04 | Discharge: 2020-01-04 | Disposition: A | Discharge status: Home / Self Care | Payer: 59 | Source: Ambulatory Visit | Attending: Interventional Radiology | Admitting: Interventional Radiology

## 2020-01-04 ENCOUNTER — Encounter (HOSPITAL_COMMUNITY): Payer: Self-pay

## 2020-01-04 DIAGNOSIS — I639 Cerebral infarction, unspecified: Secondary | ICD-10-CM

## 2020-01-04 DIAGNOSIS — I671 Cerebral aneurysm, nonruptured: Secondary | ICD-10-CM | POA: Diagnosis not present

## 2020-01-04 DIAGNOSIS — I609 Nontraumatic subarachnoid hemorrhage, unspecified: Secondary | ICD-10-CM | POA: Insufficient documentation

## 2020-03-02 IMAGING — MR MR HEAD WO/W CM
14 of 17 series · 31 of 48 positions shown · IV contrast (gadavist)
Comparison: Comparison made with previous MRI from 10/12/2018.

CLINICAL DATA: 44-year-old male with history of ruptured left
posterior communicating artery aneurysm, status post coil
embolization. Follow-up.

EXAM:
MRI HEAD WITHOUT AND WITH CONTRAST
MRA HEAD WITHOUT CONTRAST
TECHNIQUE: Multiplanar, multiecho pulse sequences of the brain and surrounding
structures were obtained without and with intravenous contrast.
Angiographic images of the head were obtained using MRA technique
without contrast.
CONTRAST:  9mL GADAVIST GADOBUTROL 1 MMOL/ML IV SOLN

[Series 5: DWI · axial · 3.0mm · 0.88mm/px · z∈[-137,+2]mm · 5 of 96 slices shown (1 of 4)]
[im 1/96]
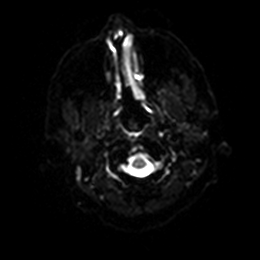
[im 24/96]
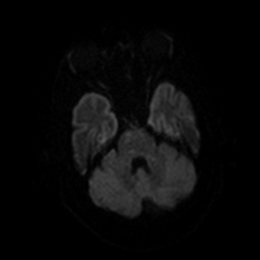
[im 48/96]
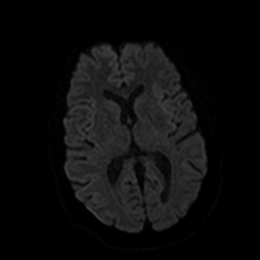
[im 72/96]
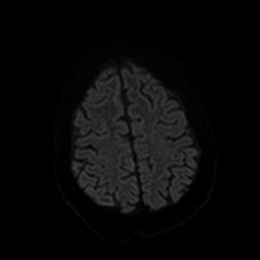
[im 96/96]
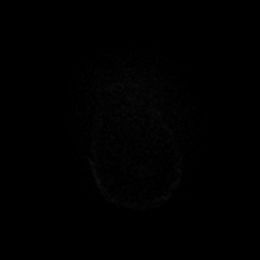

[Series 6: DWI · axial · 3.0mm · 0.88mm/px · z∈[-137,+2]mm · 3 of 48 slices shown (2 of 4)]
[im 1/48]
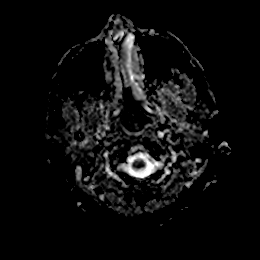
[im 24/48]
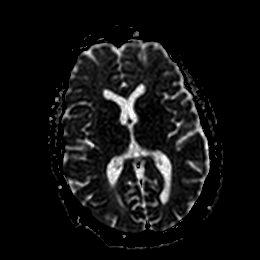
[im 48/48]
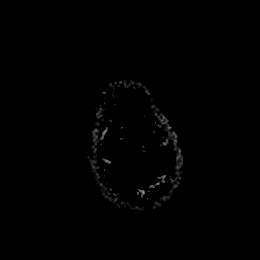

[Series 7: DWI · coronal · 4.0mm · 0.88mm/px · 4 of 70 slices shown (3 of 4)]
[im 1/70]
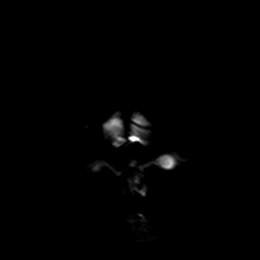
[im 24/70]
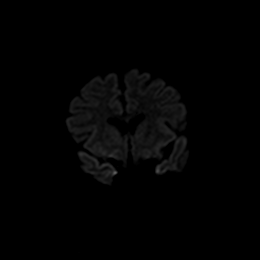
[im 47/70]
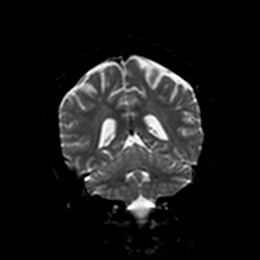
[im 70/70]
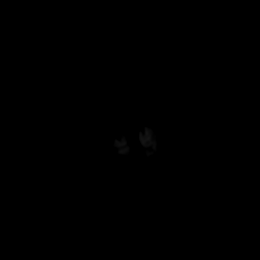

[Series 8: DWI · coronal · 4.0mm · 0.88mm/px · 2 of 35 slices shown (4 of 4)]
[im 1/35]
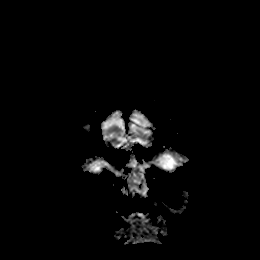
[im 35/35]
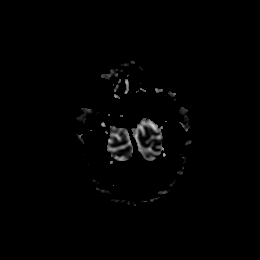

[Series 13: T1 · sagittal · 5.0mm · 0.75mm/px · 1 of 23 slices shown]
[im 1/23]
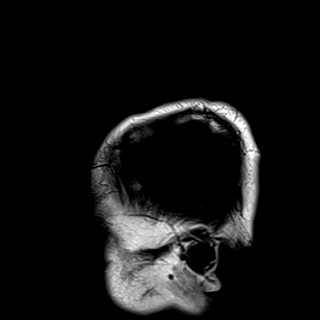

[Series 14: T2 · axial · 5.0mm · 0.72mm/px · 1 of 25 slices shown]
[im 1/25]
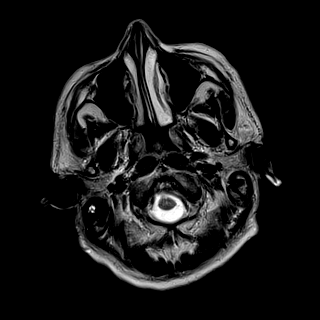

[Series 15: FLAIR · axial · 5.0mm · 0.45mm/px · 1 of 25 slices shown]
[im 1/25]
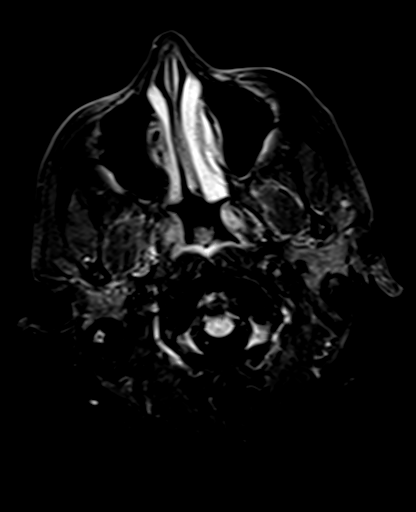

[Series 16: mag_images · axial · 3.0mm · 0.90mm/px · z∈[-141,+10]mm · 3 of 52 slices shown]
[im 1/52]
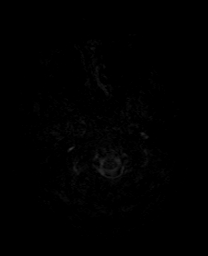
[im 26/52]
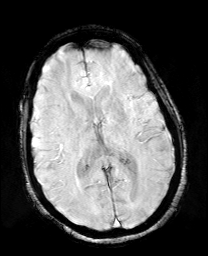
[im 52/52]
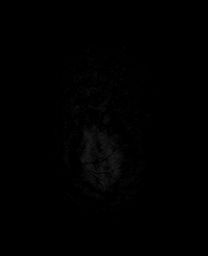

[Series 17: pha_images · axial · 3.0mm · 0.90mm/px · z∈[-141,+10]mm · 3 of 52 slices shown]
[im 1/52]
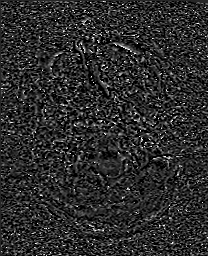
[im 26/52]
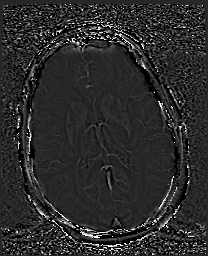
[im 52/52]
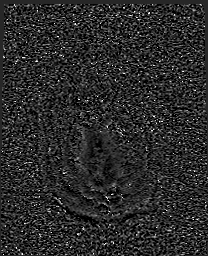

[Series 18: swi_images · axial · 3.0mm · 0.90mm/px · z∈[-141,+10]mm · 3 of 52 slices shown]
[im 1/52]
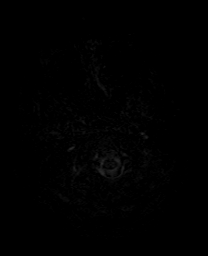
[im 26/52]
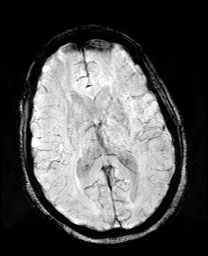
[im 52/52]
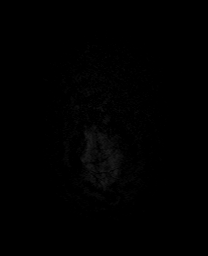

[Series 19: mip_images(sw) · axial · 24.0mm · 0.90mm/px · z∈[-130,-0]mm · 2 of 45 slices shown]
[im 1/45]
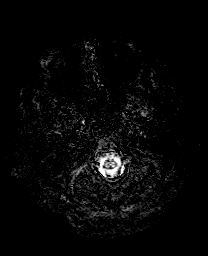
[im 45/45]
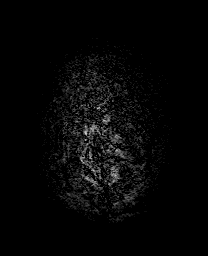

[Series 21: T2 post-contrast · coronal · 5.0mm · 0.72mm/px · 1 of 28 slices shown]
[im 1/28]
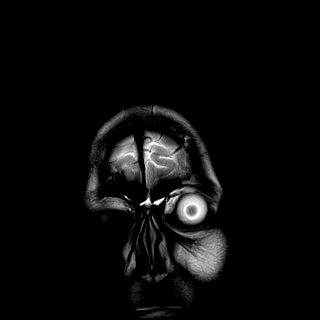

[Series 23: T1 post-contrast · coronal · 5.0mm · 0.34mm/px · 1 of 28 slices shown (1 of 2)]
[im 1/28]
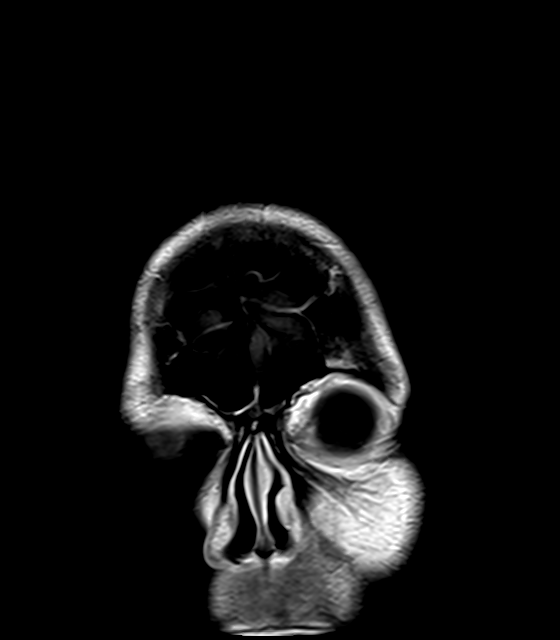

[Series 24: T1 post-contrast · sagittal · 5.0mm · 0.72mm/px · 1 of 23 slices shown (2 of 2)]
[im 1/23]
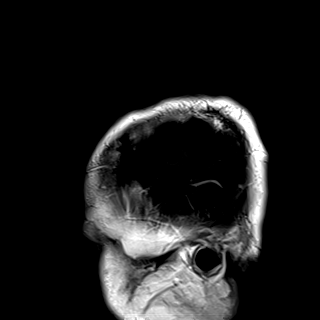

[31 of 48 positions shown; findings below may reference images not displayed]

FINDINGS: MRI HEAD FINDINGS

Brain: Cerebral volume stable, and within normal limits for age. No
significant cerebral white matter disease for age. There is a 5 mm
focus of restricted diffusion involving the cortical gray matter of
the left parietal lobe (series 5, image 83), consistent with a small
acute ischemic infarct. No associated hemorrhage or mass effect. No
other evidence for acute or subacute ischemia. Gray-white matter
differentiation otherwise maintained. No encephalomalacia to suggest
chronic cortical infarction elsewhere. No evidence for acute
intracranial hemorrhage.

No mass lesion, midline shift or mass effect. No hydrocephalus. No
extra-axial fluid collection. Pituitary gland and suprasellar region
normal. Midline structures intact.

No abnormal enhancement.

Vascular: Susceptibility artifact from coiled left ICA aneurysm
noted. Major intracranial vascular flow voids maintained.

Skull and upper cervical spine: Craniocervical junction within
normal limits. Upper cervical spine unremarkable. Bone marrow signal
intensity within normal limits. No scalp soft tissue abnormality.

Sinuses/Orbits: Globes and orbital soft tissues within normal
limits. Scattered mucosal thickening noted within the ethmoidal air
cells, likely allergic/inflammatory nature. Paranasal sinuses are
otherwise largely clear. Trace bilateral mastoid effusions, of
doubtful significance. Visualized nasopharynx within normal limits.
Inner ear structures grossly unremarkable.

Other: None.

MRA HEAD FINDINGS

ANTERIOR CIRCULATION:

Examination mildly degraded by motion artifact.

Distal cervical segments of the internal carotid arteries are patent
with symmetric antegrade flow. Distal cervical left ICA tortuous.
Petrous segments widely patent. Cavernous and supraclinoid right ICA
widely patent to the terminus without abnormality.

Susceptibility artifact related to previously coiled aneurysm at the
takeoff of the left posterior communicating artery again seen.
Aneurysm remains largely obliterated. There is a persistent neck
remnant along the inferior aspect of the coil pack measuring
approximately 5 x 5 mm (series 9, image 95). This is also seen on
MIP reconstructions (series 5344, image 5). This is relatively
stable from previous. Additional tiny 1 mm neck remnant seen more
superiorly at the base of the coil pack (series 9, image 111), also
unchanged. No interval growth or expansion of the coil pack itself
which measures 1.5 x 1.0 cm, stable (series 9, image 109). Widely
patent flow seen through the adjacent cavernous and supraclinoid
left ICA to the terminus.

A1 segments widely patent. Normal anterior communicating artery
complex. Anterior cerebral arteries widely patent to their distal
aspects. Normal M1 segments. Negative MCA bifurcations. Distal MCA
branches well perfused and symmetric.

POSTERIOR CIRCULATION:

Vertebral arteries widely patent to the vertebrobasilar junction
without stenosis. Posterior inferior cerebral arteries patent
bilaterally. Basilar widely patent to its distal aspect without
stenosis. Dominant left AICA. Superior cerebral arteries patent
bilaterally. Both PCAs well perfused to their distal aspects without
stenosis.

No new aneurysm or other vascular abnormality.
IMPRESSION: MRI HEAD IMPRESSION:

1. 5 mm acute ischemic nonhemorrhagic cortical infarct involving the
left parietal lobe as above.
2. Otherwise stable and negative brain MRI for age.

MRA HEAD IMPRESSION:

1. Sequelae of prior coil embolization of left posterior
communicating artery aneurysm with persistent neck remnants
measuring up to 5 mm as above. Findings are not significantly
changed as compared to previous exam.
2. Otherwise stable and normal intracranial MRA.

These results will be called to the ordering clinician or
representative by the Radiologist Assistant, and communication
documented in the PACS or zVision Dashboard.

## 2023-06-28 ENCOUNTER — Emergency Department (HOSPITAL_COMMUNITY): Payer: Self-pay

## 2023-06-28 ENCOUNTER — Emergency Department (HOSPITAL_COMMUNITY)
Admission: EM | Admit: 2023-06-28 | Discharge: 2023-06-29 | Disposition: A | Discharge status: Home / Self Care | Payer: Self-pay | Attending: Emergency Medicine | Admitting: Emergency Medicine

## 2023-06-28 ENCOUNTER — Other Ambulatory Visit: Payer: Self-pay

## 2023-06-28 DIAGNOSIS — G40309 Generalized idiopathic epilepsy and epileptic syndromes, not intractable, without status epilepticus: Secondary | ICD-10-CM | POA: Insufficient documentation

## 2023-06-28 DIAGNOSIS — Z20822 Contact with and (suspected) exposure to covid-19: Secondary | ICD-10-CM | POA: Insufficient documentation

## 2023-06-28 DIAGNOSIS — R569 Unspecified convulsions: Secondary | ICD-10-CM

## 2023-06-28 LAB — CBC WITH DIFFERENTIAL/PLATELET
Abs Immature Granulocytes: 0.01 10*3/uL (ref 0.00–0.07)
Basophils Absolute: 0.1 10*3/uL (ref 0.0–0.1)
Basophils Relative: 2 %
Eosinophils Absolute: 0 10*3/uL (ref 0.0–0.5)
Eosinophils Relative: 0 %
HCT: 39 % (ref 39.0–52.0)
Hemoglobin: 12.6 g/dL — ABNORMAL LOW (ref 13.0–17.0)
Immature Granulocytes: 0 %
Lymphocytes Relative: 25 %
Lymphs Abs: 0.9 10*3/uL (ref 0.7–4.0)
MCH: 30.4 pg (ref 26.0–34.0)
MCHC: 32.3 g/dL (ref 30.0–36.0)
MCV: 94 fL (ref 80.0–100.0)
Monocytes Absolute: 0.4 10*3/uL (ref 0.1–1.0)
Monocytes Relative: 11 %
Neutro Abs: 2.3 10*3/uL (ref 1.7–7.7)
Neutrophils Relative %: 62 %
Platelets: 89 10*3/uL — ABNORMAL LOW (ref 150–400)
RBC: 4.15 MIL/uL — ABNORMAL LOW (ref 4.22–5.81)
RDW: 15.1 % (ref 11.5–15.5)
WBC: 3.7 10*3/uL — ABNORMAL LOW (ref 4.0–10.5)
nRBC: 0 % (ref 0.0–0.2)

## 2023-06-28 LAB — ETHANOL: Alcohol, Ethyl (B): <10 mg/dL (ref <10)

## 2023-06-28 LAB — COMPREHENSIVE METABOLIC PANEL
ALT: 65 U/L — ABNORMAL HIGH (ref 0–44)
AST: 162 U/L — ABNORMAL HIGH (ref 15–41)
Albumin: 3.8 g/dL (ref 3.5–5.0)
Alkaline Phosphatase: 61 U/L (ref 38–126)
Anion gap: 18 — ABNORMAL HIGH (ref 5–15)
BUN: 7 mg/dL (ref 6–20)
CO2: 16 mmol/L — ABNORMAL LOW (ref 22–32)
Calcium: 8.8 mg/dL — ABNORMAL LOW (ref 8.9–10.3)
Chloride: 104 mmol/L (ref 98–111)
Creatinine, Ser: 0.79 mg/dL (ref 0.61–1.24)
GFR, Estimated: >60 mL/min (ref >60)
Glucose, Bld: 129 mg/dL — ABNORMAL HIGH (ref 70–99)
Potassium: 4.2 mmol/L (ref 3.5–5.1)
Sodium: 138 mmol/L (ref 135–145)
Total Bilirubin: 1.4 mg/dL — ABNORMAL HIGH (ref 0.0–1.2)
Total Protein: 6.7 g/dL (ref 6.5–8.1)

## 2023-06-28 LAB — CBG MONITORING, ED
Glucose-Capillary: 136 mg/dL — ABNORMAL HIGH (ref 70–99)
Glucose-Capillary: 122 mg/dL — ABNORMAL HIGH (ref 70–99)

## 2023-06-28 LAB — I-STAT CG4 LACTIC ACID, ED: Lactic Acid, Venous: 2.4 mmol/L (ref 0.5–1.9)

## 2023-06-28 MED ORDER — ADULT MULTIVITAMIN W/MINERALS CH
1.0000 | ORAL_TABLET | Freq: Every day | ORAL | Status: DC
  Administered 2023-06-28: 1 via ORAL
  Filled 2023-06-28: qty 1

## 2023-06-28 MED ORDER — LORAZEPAM 2 MG/ML IJ SOLN: 1.0000 mg | INTRAMUSCULAR | Status: DC | PRN

## 2023-06-28 MED ORDER — LORAZEPAM 1 MG PO TABS
1.0000 mg | ORAL_TABLET | ORAL | Status: DC | PRN
Start: 2023-06-28 — End: 2023-07-01

## 2023-06-28 MED ORDER — THIAMINE HCL 100 MG/ML IJ SOLN: 100.0000 mg | Freq: Every day | INTRAMUSCULAR | Status: DC

## 2023-06-28 MED ORDER — ACETAMINOPHEN 500 MG PO TABS
1000.0000 mg | ORAL_TABLET | Freq: Once | ORAL | Status: AC
  Administered 2023-06-28: 1000 mg via ORAL
  Filled 2023-06-28: qty 2

## 2023-06-28 MED ORDER — THIAMINE MONONITRATE 100 MG PO TABS
100.0000 mg | ORAL_TABLET | Freq: Every day | ORAL | Status: DC
  Administered 2023-06-28: 100 mg via ORAL
  Filled 2023-06-28: qty 1

## 2023-06-28 MED ORDER — IOHEXOL 350 MG/ML SOLN
75.0000 mL | Freq: Once | INTRAVENOUS | Status: AC | PRN
  Administered 2023-06-28: 75 mL via INTRAVENOUS

## 2023-06-28 MED ORDER — FOLIC ACID 1 MG PO TABS
1.0000 mg | ORAL_TABLET | Freq: Every day | ORAL | Status: DC
  Administered 2023-06-28: 1 mg via ORAL
  Filled 2023-06-28: qty 1

## 2023-06-28 MED ORDER — SODIUM CHLORIDE 0.9 % IV BOLUS
1000.0000 mL | Freq: Once | INTRAVENOUS | Status: AC
  Administered 2023-06-28: 1000 mL via INTRAVENOUS

## 2023-06-28 NOTE — ED Notes (Addendum)
Wife approached desk and was agitated. Pt wife states "We have been here since 3pm and we havent seen anybody. Where's the doctor? Where's the nurse?" Pt swinging her hands in this RN's face. This RN stated, "Lets go into your room and I will see what I can do to help you." Wife begins very aggressively speaking to this RN saying, "We have been here since 3pm and we haven't heard anything". This RN explained that I had asked the MD to come speak with her to give her an update but he must be tied up with another patient. This RN assessed pt's needs and pain level. Pt states that he is not in pain but has a slight headache every time he coughs. Pt asks wife to calm down and wife yells at husband, "I will not calm down, I am being your advocate!" Pt's wife continues to escalate and speak very aggressively to this RN. This RN stated calmly, "Maam, I have spoken to and treated you with respect, I expect you to speak to and treat me with respect." Pt wife yelled, "Don't you dare do that to me, I have treated you with respect, but I am starting to get agitated." This RN states, "I will try to get the doctor to come speak to you". Upon leaving the room for my safety, pt wife acted like she was going to chase me out of the room in a threatening manner and VERY loudly and aggressively slammed the door.  Security called to escort wife out of the department for inappropriate behavior and threatening staff.  Security allows pt wife to stay in the room regardless of behavior.  Charge RN Fleet Contras and [place], Facilities manager made aware of situation.

## 2023-06-28 NOTE — ED Provider Triage Note (Signed)
Emergency Medicine Provider Triage Evaluation Note  Clarence Larsen , a 48 y.o. male  was evaluated in triage.  Pt complains of syncope.  History is actually obtained by EMS.  Patient is unaware of what happened.  He states that he completed a work today, after [employer] representative.  He was at home, when he awakened with EMS providers at bedside.  Some report of alcohol, though this is unclear.  Patient currently denies any pain, weakness, confusion, dyspnea.  Review of Systems  Positive: Syncope Negative: Chest pain, numbness  Physical Exam  There were no vitals taken for this visit. Gen:   Awake, no distress speaking clearly Resp:  Normal effort no increased work of breathing MSK:   Moves extremities without difficulty no deformity Other:  Head with anterior abrasion, hematoma Neuro unremarkable Neck: Range of motion normal, no point tenderness, no deformity, crepitus, step-off.  Medical Decision Making  Medically screening exam initiated at 2:50 PM.  Appropriate orders placed.  [facility] Nurse was informed that the remainder of the evaluation will be completed by another provider, this initial triage assessment does not replace that evaluation, and the importance of remaining in the ED until their evaluation is complete.   Gerhard Munch, MD 06/28/23 [phone]

## 2023-06-28 NOTE — ED Notes (Signed)
Patient had witnessed seizure in lobby with family. Patient did not fall from chair-placed on ems stretcher and moved to room 5. Patient awake but obviously postictal. No tongue/mouth trauma noted, no incontinence, abrasion to forehead occurred at home earlier. Patient undressed, placed on monitor. Seizure pads placed.

## 2023-06-28 NOTE — ED Provider Notes (Incomplete)
[place] EMERGENCY DEPARTMENT AT [facility] Provider Note   CSN: [id number] Arrival date & time: 06/28/23  1447     History  Chief Complaint  Patient presents with   Loss of Consciousness    Clarence Larsen is a 48 y.o. male.   Loss of Consciousness    48 year old male with medical history significant for ruptured intracranial aneurysm in December 2019 and subarachnoid hemorrhage status post embolization, atrial fibrillation, chronic daily alcohol use who presents to the emergency department after syncopal episode.  Unclear if the patient had a syncopal episode or seizure.  He was found down on the floor.  He normally drinks around 12 beers a day and does have withdrawal symptoms when he tries to quit.  His significant other states that he has had 1 previous lifetime seizure.  Initially seen in triage, syncope workup initiated and then patient had a 5-minute tonic-clonic seizure in the emergency department.  He was bedded emergently.  Given his history, CT angiogram head and neck was ordered.  Initial CBG on arrival was 136, repeat after his seizure was 122.  The patient is amnestic to the event.  No known tongue biting or urinary incontinence.  C-spine was cleared in triage.  Home Medications Prior to Admission medications   Medication Sig Start Date End Date Taking? Authorizing Provider  aspirin 81 MG chewable tablet Chew 1 tablet (81 mg total) by mouth daily. Patient not taking: Reported on 04/25/2018 04/23/16   McKeag, Janine Ores, MD  butalbital-acetaminophen-caffeine (FIORICET, [facility]) [id number] MG tablet Take 1-2 tablets by mouth every 4 (four) hours as needed for headache. 05/05/18   Julio Sicks, MD  metoprolol succinate (TOPROL-XL) 25 MG 24 hr tablet Take 1 tablet (25 mg total) by mouth daily. Patient not taking: Reported on 04/25/2018 04/14/17   Berton Bon, MD  niMODipine (NIMOTOP) 30 MG capsule Take 2 capsules (60 mg total) by mouth every 4 (four) hours for 10  days. 05/05/18 05/15/18  Julio Sicks, MD      Allergies    Patient has no known allergies.    Review of Systems   Review of Systems  Cardiovascular:  Positive for syncope.    Physical Exam Updated Vital Signs BP 121/85 (BP Location: Right Arm)   Pulse 88   Temp 98.6 F (37 C) (Oral)   Resp 20   SpO2 98%  Physical Exam  ED Results / Procedures / Treatments   Labs (all labs ordered are listed, but only abnormal results are displayed) Labs Reviewed  COMPREHENSIVE METABOLIC PANEL - Abnormal; Notable for the following components:      Result Value   CO2 16 (*)    Glucose, Bld 129 (*)    Calcium 8.8 (*)    AST 162 (*)    ALT 65 (*)    Total Bilirubin 1.4 (*)    Anion gap 18 (*)    All other components within normal limits  CBC WITH DIFFERENTIAL/PLATELET - Abnormal; Notable for the following components:   WBC 3.7 (*)    RBC 4.15 (*)    Hemoglobin 12.6 (*)    Platelets 89 (*)    All other components within normal limits  CBG MONITORING, ED - Abnormal; Notable for the following components:   Glucose-Capillary 136 (*)    All other components within normal limits  ETHANOL    EKG None  Radiology DG Chest Port 1 View Result Date: 06/28/2023 CLINICAL DATA:  LH?[name]  Syncope. EXAM: PORTABLE CHEST 1  VIEW COMPARISON:  04/23/2016. FINDINGS: Bilateral lung fields are clear. Bilateral costophrenic angles are clear. Normal cardio-mediastinal silhouette. No acute osseous abnormalities. The soft tissues are within normal limits. IMPRESSION: No active disease. Electronically Signed   By: Jules Schick M.D.   On: 06/28/2023 17:08    Procedures Procedures  {Document cardiac monitor, telemetry assessment procedure when appropriate:1}  Medications Ordered in ED Medications  sodium chloride 0.9 % bolus 1,000 mL (0 mLs Intravenous Stopped 06/28/23 1716)    ED Course/ Medical Decision Making/ A&P   {   Click here for ABCD2, HEART and other calculatorsREFRESH Note before signing :1}                               Medical Decision Making Amount and/or Complexity of Data Reviewed Radiology: ordered.  Risk OTC drugs. Prescription drug management.   ***  {Document critical care time when appropriate:1} {Document review of labs and clinical decision tools ie heart score, Chads2Vasc2 etc:1}  {Document your independent review of radiology images, and any outside records:1} {Document your discussion with family members, caretakers, and with consultants:1} {Document social determinants of health affecting pt's care:1} {Document your decision making why or why not admission, treatments were needed:1} Final Clinical Impression(s) / ED Diagnoses Final diagnoses:  None    Rx / DC Orders ED Discharge Orders     None

## 2023-06-28 NOTE — ED Notes (Addendum)
Pt wife approached desk asking for pain meds and an update. This RN sent a message to MD requesting pain meds and to go speak with wife.

## 2023-06-28 NOTE — ED Notes (Signed)
Pt family member repeatedly rings call bell. NT responds to call bell to assess needs multiple times.

## 2023-06-28 NOTE — ED Triage Notes (Signed)
Patient had syncopal episode after walking into his house today. Does not remember event but his wife found him lying on the floor. No seizure activity. C collar applied by EMS and c spine cleared by EDP prior to getting into wheelchair for triage. Continues to have repetitive questioning. Abrasion to forehead.

## 2023-06-28 NOTE — ED Notes (Signed)
Pt family member voiced concern each time the monitor alarmed, stating the staff "wouldn't know if he was half dead." As tech I reassured the family that the pt vitals were being contiguously monitored and explained that the alarm sometimes sounds due to machine picking up signals. Despite this. The family member stated they would call each time the alarm went off. The pt call bell was activated multiple times after I explained everything, and each time the call bell went off I responded each time.

## 2023-06-29 LAB — RESP PANEL BY RT-PCR (RSV, FLU A&B, COVID)  RVPGX2
Influenza A by PCR: NEGATIVE
Influenza B by PCR: NEGATIVE
Resp Syncytial Virus by PCR: NEGATIVE
SARS Coronavirus 2 by RT PCR: NEGATIVE

## 2023-06-29 MED ORDER — LEVETIRACETAM 500 MG PO TABS: 500.0000 mg | ORAL_TABLET | Freq: Two times a day (BID) | ORAL | 0 refills | Status: DC

## 2023-06-29 NOTE — Discharge Instructions (Signed)
 Department of Motor Vehicle (DMV) of [place] regulations for seizures - It is the patient's responsibility to report the incidence of the seizure in the state of [place]. [place]  has no statutory provision requiring physicians to report patients diagnosed with epilepsy or seizures to a central state agency.  The recommended DMV regulation requirement for a driver in [place] for an individual with a seizure is that they be seizure-free for 6-12 months. However, the DMV may consider the following exceptions to this general rule where: (1) a physician-directed change in medication causes a seizure and the individual immediately resumes the previous therapy which controlled seizures; (2) there is a history of nocturnal seizures or seizures which do not involve loss of consciousness, loss of control of motor function, or loss of appropriate sensation and information process; and (3) an individual has a seizure disorder preceded by an aura (warning) lasting 2-3 minutes. While the [facility] may also give consideration to other unusual circumstances which may affect the general requirement that drivers be seizure-free for 6-12 months, interpretation of these circumstances and assignment of restrictions is at the discretion of the Medical Advisor. The DMV also considers compliance with medical therapy essential for safe driving. [The [place]  Physician's Guide to [employer] (June, 1995 ed.)] The Department learns of an individual's condition by inquiring on the application form or renewal form, a physician's report to the [facility], an accident report or from correspondence from the individual. The person may be required to submit a Medical Report Form either annually or semi-annually.

## 2023-07-28 ENCOUNTER — Ambulatory Visit: Payer: PRIVATE HEALTH INSURANCE | Admitting: Neurology

## 2023-07-28 ENCOUNTER — Encounter: Payer: Self-pay | Admitting: Neurology

## 2023-07-28 VITALS — BP 105/67 | HR 81 | Ht 71.0 in | Wt 176.0 lb

## 2023-07-28 DIAGNOSIS — G40909 Epilepsy, unspecified, not intractable, without status epilepticus: Secondary | ICD-10-CM | POA: Diagnosis not present

## 2023-07-28 MED ORDER — VALTOCO 20 MG DOSE 10 MG/0.1ML NA LQPK: 20.0000 mg | NASAL | 2 refills | Status: AC | PRN

## 2023-07-28 MED ORDER — THIAMINE HCL 100 MG PO TABS: 100.0000 mg | ORAL_TABLET | Freq: Every day | ORAL | 3 refills | Status: AC

## 2023-07-28 MED ORDER — CLOBAZAM 10 MG PO TABS
10.0000 mg | ORAL_TABLET | Freq: Every evening | ORAL | 5 refills | Status: DC
Start: 2023-07-28 — End: 2024-02-29

## 2023-07-28 NOTE — Progress Notes (Signed)
 [facility]  PATIENT: Clarence Larsen DOB: 25-Apr-1976  REQUESTING CLINICIAN: Ernie Avena, MD HISTORY FROM: Patient and spouse  REASON FOR VISIT: Seizure    HISTORICAL  CHIEF COMPLAINT:  Chief Complaint  Patient presents with   New Patient (Initial Visit)    Pt in 13, here with wife Clarence Larsen  Pt is referred by ED for seizures. Pt states he has not had any seizures since ED visit in February. Pt states he was given Keppra in ER but did not take it because it makes him drowsy and he needs to drive to go to work.     HISTORY OF PRESENT ILLNESS:  This is a 48 year old gentleman past medical history of rupture aneurysm status post coil embolization, alcohol use disorder who is presenting for follow-up for his seizures.  Wife tells me that patient has his first seizure in 2022.  In June 28, 2023, she found him at home having a seizure, EMS was called, patient taken to the hospital where he had a second seizure.  Seizure described as generalized convulsion. His vital was within normal limits.  Head CT did not show any acute abnormality.  EEG was not completed.  At discharge he was recommended levetiracetam 500 mg twice daily but patient has not been taking the medication due to somnolence.   Handedness: Rihgt handed   Onset: 2002  Seizure Type: Generalized convulsion   Current frequency: Only 2 reported seizures  Any injuries from seizures: Tongue biting, bruises   Seizure risk factors: Rupture aneurysm s/p coil embolization, Alcohol abuse   Previous ASMs: Levetiracetam   Currenty ASMs: Levetiracetam 500 mg twice daily   ASMs side effects: Drowsiness, tiredness   Brain Images: No acute abnormalities.   Previous EEGs: Not previously done    OTHER MEDICAL CONDITIONS: Aneurysm rupture s/p coil embolization, alcohol abuse,   REVIEW OF SYSTEMS: Full 14 system review of systems performed and negative with exception of: As noted in the  HPI  ALLERGIES: Allergies  Allergen Reactions   Atorvastatin Other (See Comments)    Diarrhea, weight loss, low appetite    HOME MEDICATIONS: Outpatient Medications Prior to Visit  Medication Sig Dispense Refill   Magnesium 300 MG CAPS Take by mouth.     Multiple Vitamin (MULTIVITAMIN) tablet Take 1 tablet by mouth daily.     vitamin B-12 (CYANOCOBALAMIN) 100 MCG tablet Take 100 mcg by mouth daily.     Zinc 100 MG TABS Take by mouth.     levETIRAcetam (KEPPRA) 500 MG tablet Take 1 tablet (500 mg total) by mouth 2 (two) times daily. (Patient not taking: Reported on 07/28/2023) 180 tablet 0   No facility-administered medications prior to visit.    PAST MEDICAL HISTORY: Past Medical History:  Diagnosis Date   Seizures (HCC)     PAST SURGICAL HISTORY: Past Surgical History:  Procedure Laterality Date   IR ANGIO INTRA EXTRACRAN SEL COM CAROTID INNOMINATE UNI R MOD SED  04/25/2018   IR ANGIO INTRA EXTRACRAN SEL INTERNAL CAROTID UNI L MOD SED  04/25/2018   IR ANGIO VERTEBRAL SEL VERTEBRAL BILAT MOD SED  04/25/2018   IR ANGIOGRAM FOLLOW UP STUDY  04/25/2018   IR ANGIOGRAM FOLLOW UP STUDY  04/25/2018   IR ANGIOGRAM FOLLOW UP STUDY  04/25/2018   IR ANGIOGRAM FOLLOW UP STUDY  04/25/2018   IR ANGIOGRAM FOLLOW UP STUDY  04/25/2018   IR ANGIOGRAM FOLLOW UP STUDY  04/25/2018   IR ANGIOGRAM FOLLOW UP STUDY  04/25/2018  IR ANGIOGRAM FOLLOW UP STUDY  04/25/2018   IR ANGIOGRAM FOLLOW UP STUDY  04/25/2018   IR ANGIOGRAM FOLLOW UP STUDY  04/25/2018   IR ANGIOGRAM FOLLOW UP STUDY  04/25/2018   IR ANGIOGRAM FOLLOW UP STUDY  04/25/2018   IR ANGIOGRAM FOLLOW UP STUDY  04/25/2018   IR ANGIOGRAM FOLLOW UP STUDY  04/25/2018   IR ANGIOGRAM FOLLOW UP STUDY  04/25/2018   IR ANGIOGRAM FOLLOW UP STUDY  04/25/2018   IR ANGIOGRAM FOLLOW UP STUDY  04/25/2018   IR ANGIOGRAM FOLLOW UP STUDY  04/25/2018   IR TRANSCATH/EMBOLIZ  04/25/2018   RADIOLOGY WITH ANESTHESIA N/A 04/25/2018   Procedure: IR WITH ANESTHESIA;  Surgeon:  Radiologist, Medication, MD;  Location: [facility] OR;  Service: Radiology;  Laterality: N/A;    FAMILY HISTORY: Family History  Problem Relation Age of Onset   Cancer Father        Spleen cancer on chemo    SOCIAL HISTORY: Social History   Socioeconomic History   Marital status: Married    Spouse name: Not on file   Number of children: Not on file   Years of education: Not on file   Highest education level: Not on file  Occupational History   Not on file  Tobacco Use   Smoking status: Every Day    Types: Cigarettes   Smokeless tobacco: Never   Tobacco comments:    Pt states he smokes 3/4 pack a day   Vaping Use   Vaping status: Never Used  Substance and Sexual Activity   Alcohol use: Yes    Comment: 6 pack a day   Drug use: No   Sexual activity: Not on file  Other Topics Concern   Not on file  Social History Narrative   Not on file   Social Drivers of Health   Financial Resource Strain: Not on file  Food Insecurity: Not on file  Transportation Needs: Not on file  Physical Activity: Not on file  Stress: Not on file  Social Connections: Not on file  Intimate Partner Violence: Not on file    PHYSICAL EXAM  GENERAL EXAM/CONSTITUTIONAL: Vitals:  Vitals:   07/28/23 1426  BP: 105/67  Pulse: 81  Weight: 176 lb (79.8 kg)  Height: 5\' 11"  (1.803 m)   Body mass index is 24.55 kg/m. Wt Readings from Last 3 Encounters:  07/28/23 176 lb (79.8 kg)  05/02/18 226 lb 6.6 oz (102.7 kg)  04/14/17 228 lb 9.6 oz (103.7 kg)   Patient is in no distress; well developed, nourished and groomed; neck is supple  MUSCULOSKELETAL: Gait, strength, tone, movements noted in Neurologic exam below  NEUROLOGIC: MENTAL STATUS:      No data to display         awake, alert, oriented to person, place and time recent and remote memory intact normal attention and concentration language fluent, comprehension intact, naming intact fund of knowledge appropriate  CRANIAL NERVE:  2nd,  3rd, 4th, 6th - Visual fields full to confrontation, extraocular muscles intact, no nystagmus 5th - facial sensation symmetric 7th - facial strength symmetric 8th - hearing intact 9th - palate elevates symmetrically, uvula midline 11th - shoulder shrug symmetric 12th - tongue protrusion midline  MOTOR:  normal bulk and tone, full strength in the BUE, BLE  SENSORY:  normal and symmetric to light touch  COORDINATION:  finger-nose-finger, fine finger movements normal  GAIT/STATION:  normal   DIAGNOSTIC DATA (LABS, IMAGING, TESTING) - I reviewed patient records, labs, notes,  testing and imaging myself where available.  Lab Results  Component Value Date   WBC 3.7 (L) 06/28/2023   HGB 12.6 (L) 06/28/2023   HCT 39.0 06/28/2023   MCV 94.0 06/28/2023   PLT 89 (L) 06/28/2023      Component Value Date/Time   NA 138 06/28/2023 1500   NA 140 04/14/2017 1646   K 4.2 06/28/2023 1500   CL 104 06/28/2023 1500   CO2 16 (L) 06/28/2023 1500   GLUCOSE 129 (H) 06/28/2023 1500   BUN 7 06/28/2023 1500   BUN 10 04/14/2017 1646   CREATININE 0.79 06/28/2023 1500   CREATININE 0.85 11/06/2010 1703   CALCIUM 8.8 (L) 06/28/2023 1500   PROT 6.7 06/28/2023 1500   PROT 7.4 04/14/2017 1646   ALBUMIN 3.8 06/28/2023 1500   ALBUMIN 4.5 04/14/2017 1646   AST 162 (H) 06/28/2023 1500   ALT 65 (H) 06/28/2023 1500   ALKPHOS 61 06/28/2023 1500   BILITOT 1.4 (H) 06/28/2023 1500   BILITOT 0.9 04/14/2017 1646   GFRNONAA >60 06/28/2023 1500   GFRAA >60 04/29/2018 0340   Lab Results  Component Value Date   CHOL 198 04/14/2017   HDL 61 04/14/2017   LDLCALC 122 (H) 04/14/2017   TRIG 87 04/27/2018   No results found for: "HGBA1C" No results found for: "VITAMINB12" Lab Results  Component Value Date   TSH 1.43 04/23/2016    CT Head 06/28/2023 No acute intracranial abnormality.   I personally reviewed brain Images.   ASSESSMENT AND PLAN  49 y.o. year old male  with history of aneurysm rupture,  status post coil embolization, alcohol use disorder who is presenting after a seizure on February 3.  This was patient's second seizure.  He was started on levetiracetam but has not been taking the medication due to side effect of somnolence.  Plan will be to start clobazam 10 mg nightly, patient will start with half tablet for 2 weeks then increase to full tablet.  Will also obtain a routine EEG.  I did advise him to contact me if he does have a breakthrough seizure or any side effect from the medication otherwise I will see him in 6 months for follow-up or sooner if worse.   1. Seizure disorder [facility])     Patient Instructions  Please continue levetiracetam Start clobazam 10 mg, half tablet nightly for 2 weeks then increase to full tablet Routine EEG, I will contact you to go over the results  Contact me if you do have a breakthrough seizure Follow-up in 6 months or sooner if worse.   Per [facility] statutes, patients with seizures are not allowed to drive until they have been seizure-free for six months.  Other recommendations include using caution when using heavy equipment or power tools. Avoid working on ladders or at heights. Take showers instead of baths.  Do not swim alone.  Ensure the water temperature is not too high on the home water heater. Do not go swimming alone. Do not lock yourself in a room alone (i.e. bathroom). When caring for infants or small children, sit down when holding, feeding, or changing them to minimize risk of injury to the child in the event you have a seizure. Maintain good sleep hygiene. Avoid alcohol.  Also recommend adequate sleep, hydration, good diet and minimize stress.   During the Seizure  - First, ensure adequate ventilation and place patients on the floor on their left side  Loosen clothing around the neck and ensure  the airway is patent. If the patient is clenching the teeth, do not force the mouth open with any object as this can cause severe  damage - Remove all items from the surrounding that can be hazardous. The patient may be oblivious to what's happening and may not even know what he or she is doing. If the patient is confused and wandering, either gently guide him/her away and block access to outside areas - Reassure the individual and be comforting - Call 911. In most cases, the seizure ends before EMS arrives. However, there are cases when seizures may last over 3 to 5 minutes. Or the individual may have developed breathing difficulties or severe injuries. If a pregnant patient or a person with diabetes develops a seizure, it is prudent to call an ambulance. - Finally, if the patient does not regain full consciousness, then call EMS. Most patients will remain confused for about 45 to 90 minutes after a seizure, so you must use judgment in calling for help. - Avoid restraints but make sure the patient is in a bed with padded side rails - Place the individual in a lateral position with the neck slightly flexed; this will help the saliva drain from the mouth and prevent the tongue from falling backward - Remove all nearby furniture and other hazards from the area - Provide verbal assurance as the individual is regaining consciousness - Provide the patient with privacy if possible - Call for help and start treatment as ordered by the caregiver   After the Seizure (Postictal Stage)  After a seizure, most patients experience confusion, fatigue, muscle pain and/or a headache. Thus, one should permit the individual to sleep. For the next few days, reassurance is essential. Being calm and helping reorient the person is also of importance.  Most seizures are painless and end spontaneously. Seizures are not harmful to others but can lead to complications such as stress on the lungs, brain and the heart. Individuals with prior lung problems may develop labored breathing and respiratory distress.    Discussed Patients with epilepsy have a  small risk of sudden unexpected death, a condition referred to as sudden unexpected death in epilepsy (SUDEP). SUDEP is defined specifically as the sudden, unexpected, witnessed or unwitnessed, nontraumatic and nondrowning death in patients with epilepsy with or without evidence for a seizure, and excluding documented status epilepticus, in which post mortem examination does not reveal a structural or toxicologic cause for death     Orders Placed This Encounter  Procedures   EEG adult    Meds ordered this encounter  Medications   cloBAZam (ONFI) 10 MG tablet    Sig: Take 1 tablet (10 mg total) by mouth at bedtime.    Dispense:  30 tablet    Refill:  5   thiamine (VITAMIN B1) 100 MG tablet    Sig: Take 1 tablet (100 mg total) by mouth daily.    Dispense:  90 tablet    Refill:  3   diazePAM, 20 MG Dose, (VALTOCO 20 MG DOSE) 2 x 10 MG/0.1ML LQPK    Sig: Place 20 mg into the nose as needed (for seizure lasting more than 2 minutes).    Dispense:  2 each    Refill:  2    Please provide 2 boxes for a total of 4 doses.    Return in about 6 months (around 01/28/2024).    Windell Norfolk, MD 07/30/2023, 2:12 PM  [facility] [address]  [place], [place] [address] [phone]

## 2023-07-30 NOTE — Patient Instructions (Signed)
 Please continue levetiracetam Start clobazam 10 mg, half tablet nightly for 2 weeks then increase to full tablet Routine EEG, I will contact you to go over the results  Contact me if you do have a breakthrough seizure Follow-up in 6 months or sooner if worse.

## 2023-08-05 ENCOUNTER — Other Ambulatory Visit (HOSPITAL_COMMUNITY): Payer: Self-pay

## 2023-08-09 ENCOUNTER — Ambulatory Visit: Payer: PRIVATE HEALTH INSURANCE | Admitting: Neurology

## 2023-08-09 ENCOUNTER — Encounter: Payer: Self-pay | Admitting: Neurology

## 2023-08-09 DIAGNOSIS — G40909 Epilepsy, unspecified, not intractable, without status epilepticus: Secondary | ICD-10-CM

## 2023-08-09 NOTE — Procedures (Signed)
    History:  48 year old man with seizure   EEG classification: Awake and drowsy  Duration: 25 minutes   Technical aspects: This EEG study was done with scalp electrodes positioned according to the 10-20 International system of electrode placement. Electrical activity was reviewed with band pass filter of 1-70Hz , sensitivity of 7 uV/mm, display speed of 45mm/sec with a 60Hz  notched filter applied as appropriate. EEG data were recorded continuously and digitally stored.   Description of the recording: The background rhythms of this recording consists of a fairly well modulated medium amplitude alpha rhythm of 11 Hz that is reactive to eye opening and closure. Present in the anterior head region is a 15-20 Hz beta activity. Photic stimulation was performed, did not show any abnormalities. Hyperventilation was also performed, did not show any abnormalities. Drowsiness was manifested by background fragmentation. No abnormal epileptiform discharges seen during this recording. There was no focal slowing. There were no electrographic seizure identified.   Abnormality: None   Impression: This is a normal awake and drowsy EEG. No evidence of interictal epileptiform discharges. Normal EEGs, however, do not rule out epilepsy.    Windell Norfolk, MD [facility]

## 2023-11-19 ENCOUNTER — Encounter (HOSPITAL_COMMUNITY): Payer: Self-pay | Admitting: Interventional Radiology

## 2024-01-31 ENCOUNTER — Encounter: Payer: Self-pay | Admitting: Neurology

## 2024-01-31 ENCOUNTER — Ambulatory Visit: Payer: PRIVATE HEALTH INSURANCE | Admitting: Neurology

## 2024-02-04 ENCOUNTER — Encounter: Payer: Self-pay | Admitting: Neurology

## 2024-02-07 DIAGNOSIS — Z0289 Encounter for other administrative examinations: Secondary | ICD-10-CM

## 2024-02-07 NOTE — Telephone Encounter (Signed)
 Called pt to r/s but first had to transfer him to billing so that he can pay the no show fee. I informed pt that once that he took care of that he would be transferred back so that we could r/s him. Pt verbalized understanding.

## 2024-02-28 ENCOUNTER — Ambulatory Visit: Payer: PRIVATE HEALTH INSURANCE | Admitting: Neurology

## 2024-02-29 ENCOUNTER — Encounter: Payer: Self-pay | Admitting: Neurology

## 2024-02-29 ENCOUNTER — Ambulatory Visit: Payer: PRIVATE HEALTH INSURANCE | Admitting: Neurology

## 2024-02-29 VITALS — BP 121/87 | HR 85 | Ht 71.5 in | Wt 180.0 lb

## 2024-02-29 DIAGNOSIS — R569 Unspecified convulsions: Secondary | ICD-10-CM | POA: Diagnosis not present

## 2024-02-29 DIAGNOSIS — F1010 Alcohol abuse, uncomplicated: Secondary | ICD-10-CM | POA: Diagnosis not present

## 2024-02-29 MED ORDER — CLOBAZAM 10 MG PO TABS: 10.0000 mg | ORAL_TABLET | Freq: Every evening | ORAL | 5 refills | Status: AC

## 2024-02-29 NOTE — Progress Notes (Signed)
 [facility]  PATIENT: Clarence Larsen DOB: 11/15/1975  REQUESTING CLINICIAN: No ref. provider found HISTORY FROM: Patient and spouse  REASON FOR VISIT: Seizure   HISTORICAL  CHIEF COMPLAINT:  Chief Complaint  Patient presents with   Follow-up    Pt in room 15 alone. Here for seizure follow up.    HISTORY OF PRESENT ILLNESS:  02/29/24 SS: EEG was normal in March 2025.  Stop taking clobazam  after 3 days, made him feel tired.  No seizure since last seen.  We reviewed his only 2 seizures have occurred during excessive alcohol  intake.  He usually drinks at least 6 beers a day.  He works in [employer] as a Merchandiser, retail.  He does not climb heights or operate heavy equipment.  07/28/23 Dr. Gregg: This is a 48 year old gentleman past medical history of rupture aneurysm status post coil embolization, alcohol  use disorder who is presenting for follow-up for his seizures.  Wife tells me that patient has his first seizure in 2022.  In June 28, 2023, she found him at home having a seizure, EMS was called, patient taken to the hospital where he had a second seizure.  Seizure described as generalized convulsion. His vital was within normal limits.  Head CT did not show any acute abnormality.  EEG was not completed.  At discharge he was recommended levetiracetam  500 mg twice daily but patient has not been taking the medication due to somnolence.  Handedness: Rihgt handed   Onset: 2002  Seizure Type: Generalized convulsion   Current frequency: Only 2 reported seizures  Any injuries from seizures: Tongue biting, bruises   Seizure risk factors: Rupture aneurysm s/p coil embolization, Alcohol  abuse   Previous ASMs: Levetiracetam    Currenty ASMs: Levetiracetam  500 mg twice daily   ASMs side effects: Drowsiness, tiredness   Brain Images: No acute abnormalities.   Previous EEGs: Not previously done    OTHER MEDICAL CONDITIONS: Aneurysm rupture s/p coil embolization, alcohol   abuse,   REVIEW OF SYSTEMS: Full 14 system review of systems performed and negative with exception of: As noted in the HPI  ALLERGIES: Allergies  Allergen Reactions   Atorvastatin Other (See Comments)    Diarrhea, weight loss, low appetite    HOME MEDICATIONS: Outpatient Medications Prior to Visit  Medication Sig Dispense Refill   Multiple Vitamin (MULTIVITAMIN) tablet Take 1 tablet by mouth daily.     cloBAZam  (ONFI ) 10 MG tablet Take 1 tablet (10 mg total) by mouth at bedtime. (Patient not taking: Reported on 02/29/2024) 30 tablet 5   diazePAM , 20 MG Dose, (VALTOCO  20 MG DOSE) 2 x 10 MG/0.1ML LQPK Place 20 mg into the nose as needed (for seizure lasting more than 2 minutes). (Patient not taking: Reported on 02/29/2024) 2 each 2   Magnesium 300 MG CAPS Take by mouth. (Patient not taking: Reported on 02/29/2024)     thiamine  (VITAMIN B1) 100 MG tablet Take 1 tablet (100 mg total) by mouth daily. (Patient not taking: Reported on 02/29/2024) 90 tablet 3   vitamin B-12 (CYANOCOBALAMIN) 100 MCG tablet Take 100 mcg by mouth daily. (Patient not taking: Reported on 02/29/2024)     Zinc 100 MG TABS Take by mouth. (Patient not taking: Reported on 02/29/2024)     No facility-administered medications prior to visit.    PAST MEDICAL HISTORY: Past Medical History:  Diagnosis Date   Seizures (HCC)     PAST SURGICAL HISTORY: Past Surgical History:  Procedure Laterality Date   IR ANGIO INTRA EXTRACRAN SEL  COM CAROTID INNOMINATE UNI R MOD SED  04/25/2018   IR ANGIO INTRA EXTRACRAN SEL INTERNAL CAROTID UNI L MOD SED  04/25/2018   IR ANGIO VERTEBRAL SEL VERTEBRAL BILAT MOD SED  04/25/2018   IR ANGIOGRAM FOLLOW UP STUDY  04/25/2018   IR ANGIOGRAM FOLLOW UP STUDY  04/25/2018   IR ANGIOGRAM FOLLOW UP STUDY  04/25/2018   IR ANGIOGRAM FOLLOW UP STUDY  04/25/2018   IR ANGIOGRAM FOLLOW UP STUDY  04/25/2018   IR ANGIOGRAM FOLLOW UP STUDY  04/25/2018   IR ANGIOGRAM FOLLOW UP STUDY  04/25/2018   IR ANGIOGRAM FOLLOW  UP STUDY  04/25/2018   IR ANGIOGRAM FOLLOW UP STUDY  04/25/2018   IR ANGIOGRAM FOLLOW UP STUDY  04/25/2018   IR ANGIOGRAM FOLLOW UP STUDY  04/25/2018   IR ANGIOGRAM FOLLOW UP STUDY  04/25/2018   IR ANGIOGRAM FOLLOW UP STUDY  04/25/2018   IR ANGIOGRAM FOLLOW UP STUDY  04/25/2018   IR ANGIOGRAM FOLLOW UP STUDY  04/25/2018   IR ANGIOGRAM FOLLOW UP STUDY  04/25/2018   IR ANGIOGRAM FOLLOW UP STUDY  04/25/2018   IR ANGIOGRAM FOLLOW UP STUDY  04/25/2018   IR TRANSCATH/EMBOLIZ  04/25/2018   RADIOLOGY WITH ANESTHESIA N/A 04/25/2018   Procedure: IR WITH ANESTHESIA;  Surgeon: Radiologist, Medication, MD;  Location: [facility] OR;  Service: Radiology;  Laterality: N/A;    FAMILY HISTORY: Family History  Problem Relation Age of Onset   Cancer Father        Spleen cancer on chemo    SOCIAL HISTORY: Social History   Socioeconomic History   Marital status: Married    Spouse name: Not on file   Number of children: Not on file   Years of education: Not on file   Highest education level: Not on file  Occupational History   Not on file  Tobacco Use   Smoking status: Every Day    Types: Cigarettes   Smokeless tobacco: Never   Tobacco comments:    Pt states he smokes 3/4 pack a day   Vaping Use   Vaping status: Never Used  Substance and Sexual Activity   Alcohol  use: Yes    Comment: 6 pack a day   Drug use: Yes    Types: Marijuana    Comment: sometimes   Sexual activity: Not on file  Other Topics Concern   Not on file  Social History Narrative   Not on file   Social Drivers of Health   Financial Resource Strain: Not on file  Food Insecurity: Low Risk  (10/08/2023)   Received from [facility]   Hunger Vital Sign    Within the past 12 months, you worried that your food would run out before you got money to buy more: Never true    Within the past 12 months, the food you bought just didn't last and you didn't have money to get more. : Never true  Transportation Needs: No Transportation Needs  (10/08/2023)   Received from [facility]    In the past 12 months, has lack of reliable transportation kept you from medical appointments, meetings, work or from getting things needed for daily living? : No  Physical Activity: Not on file  Stress: Not on file  Social Connections: Not on file  Intimate Partner Violence: Not on file    PHYSICAL EXAM  GENERAL EXAM/CONSTITUTIONAL: Vitals:  Vitals:   02/29/24 1048  BP: 121/87  Pulse: 85  Weight: 180 lb (81.6 kg)  Height: 5' 11.5 (1.816 m)    Body mass index is 24.76 kg/m. Wt Readings from Last 3 Encounters:  02/29/24 180 lb (81.6 kg)  07/28/23 176 lb (79.8 kg)  05/02/18 226 lb 6.6 oz (102.7 kg)   Physical Exam  General: The patient is alert and cooperative at the time of the examination.  Skin: No significant peripheral edema is noted.  Neurologic Exam  Mental status: The patient is alert and oriented x 3 at the time of the examination. The patient has apparent normal recent and remote memory, with an apparently normal attention span and concentration ability.  Cranial nerves: Facial symmetry is present. Speech is normal, no aphasia or dysarthria is noted. Extraocular movements are full. Visual fields are full.  Motor: The patient has good strength in all 4 extremities.  Sensory examination: Soft touch sensation is symmetric on the face, arms, and legs.  Coordination: The patient has good finger-nose-finger and heel-to-shin bilaterally.  Gait and station: The patient has a normal gait.   Reflexes: Deep tendon reflexes are symmetric.  DIAGNOSTIC DATA (LABS, IMAGING, TESTING) - I reviewed patient records, labs, notes, testing and imaging myself where available.  Lab Results  Component Value Date   WBC 3.7 (L) 06/28/2023   HGB 12.6 (L) 06/28/2023   HCT 39.0 06/28/2023   MCV 94.0 06/28/2023   PLT 89 (L) 06/28/2023      Component Value Date/Time   NA 138 06/28/2023 1500   NA 140 04/14/2017  1646   K 4.2 06/28/2023 1500   CL 104 06/28/2023 1500   CO2 16 (L) 06/28/2023 1500   GLUCOSE 129 (H) 06/28/2023 1500   BUN 7 06/28/2023 1500   BUN 10 04/14/2017 1646   CREATININE 0.79 06/28/2023 1500   CREATININE 0.85 11/06/2010 1703   CALCIUM 8.8 (L) 06/28/2023 1500   PROT 6.7 06/28/2023 1500   PROT 7.4 04/14/2017 1646   ALBUMIN 3.8 06/28/2023 1500   ALBUMIN 4.5 04/14/2017 1646   AST 162 (H) 06/28/2023 1500   ALT 65 (H) 06/28/2023 1500   ALKPHOS 61 06/28/2023 1500   BILITOT 1.4 (H) 06/28/2023 1500   BILITOT 0.9 04/14/2017 1646   GFRNONAA >60 06/28/2023 1500   GFRAA >60 04/29/2018 0340   Lab Results  Component Value Date   CHOL 198 04/14/2017   HDL 61 04/14/2017   LDLCALC 122 (H) 04/14/2017   TRIG 87 04/27/2018   No results found for: HGBA1C No results found for: VITAMINB12 Lab Results  Component Value Date   TSH 1.43 04/23/2016    CT Head 06/28/2023 No acute intracranial abnormality.   ASSESSMENT AND PLAN  48 y.o. year old male  with history of aneurysm rupture, status post coil embolization, alcohol  use disorder who is presenting after a seizure on June 28, 2023.  This was patient's second seizure.  Routine EEG in March 2025 was normal.   - Recommend restart clobazam  10 mg, start taking 1/2 tablet at bedtime for 2 weeks, then take 1 tablet at bedtime - Has Valtoco  for seizure rescue  - Limit alcohol  intake, 2 seizure events have occurred during excessive alcohol  use - Call for seizure activity, follow up in 6 months - Previously tried and failed: Keppra  (sleepy)  Meds ordered this encounter  Medications   cloBAZam  (ONFI ) 10 MG tablet    Sig: Take 1 tablet (10 mg total) by mouth at bedtime.    Dispense:  30 tablet    Refill:  5   Lauraine Born, AGNP-C, DNP  [facility] [address], [address] [place], [place] [address] [phone]

## 2024-02-29 NOTE — Patient Instructions (Addendum)
 Start taking clobazam  10 mg, start taking 1/2 tablet at bedtime for 2 weeks, then take 1 tablet at bedtime  Call for seizure activity  Limit alcohol  use Follow up in 6 months
